# Patient Record
Sex: Female | Born: 1990 | Race: Black or African American | Hispanic: No | Marital: Single | Smoking: Never smoker
Health system: Southern US, Community
[De-identification: ages and names within clinical notes are randomized; demographics above are authoritative.]

## PROBLEM LIST (undated history)

## (undated) ENCOUNTER — Inpatient Hospital Stay (HOSPITAL_COMMUNITY): Payer: Self-pay

## (undated) DIAGNOSIS — Z789 Other specified health status: Secondary | ICD-10-CM

## (undated) HISTORY — PX: NO PAST SURGERIES: SHX2092

---

## 2010-07-25 ENCOUNTER — Ambulatory Visit (INDEPENDENT_AMBULATORY_CARE_PROVIDER_SITE_OTHER): Payer: BC Managed Care – PPO | Admitting: Women's Health

## 2010-07-25 DIAGNOSIS — Z23 Encounter for immunization: Secondary | ICD-10-CM

## 2010-07-25 DIAGNOSIS — Z7689 Persons encountering health services in other specified circumstances: Secondary | ICD-10-CM

## 2010-07-25 DIAGNOSIS — Z113 Encounter for screening for infections with a predominantly sexual mode of transmission: Secondary | ICD-10-CM

## 2010-07-25 DIAGNOSIS — Z01419 Encounter for gynecological examination (general) (routine) without abnormal findings: Secondary | ICD-10-CM

## 2010-07-25 DIAGNOSIS — N912 Amenorrhea, unspecified: Secondary | ICD-10-CM

## 2010-07-25 DIAGNOSIS — N926 Irregular menstruation, unspecified: Secondary | ICD-10-CM

## 2010-07-25 DIAGNOSIS — Z3041 Encounter for surveillance of contraceptive pills: Secondary | ICD-10-CM

## 2010-11-20 ENCOUNTER — Inpatient Hospital Stay (HOSPITAL_COMMUNITY)
Admission: RE | Admit: 2010-11-20 | Discharge: 2010-11-20 | Disposition: A | Discharge status: Home / Self Care | Payer: BC Managed Care – PPO | Source: Ambulatory Visit | Attending: Family Medicine | Admitting: Family Medicine

## 2010-12-25 ENCOUNTER — Ambulatory Visit (INDEPENDENT_AMBULATORY_CARE_PROVIDER_SITE_OTHER): Payer: BC Managed Care – PPO | Admitting: Anesthesiology

## 2010-12-25 DIAGNOSIS — Z23 Encounter for immunization: Secondary | ICD-10-CM

## 2010-12-27 ENCOUNTER — Telehealth: Payer: Self-pay | Admitting: *Deleted

## 2010-12-27 NOTE — Telephone Encounter (Signed)
Patient states she was treated for yeast infection by urgent care, but said symptoms were not gone.  Advised patient to call urgent care back to see if they could call something else in since they had just saw her.  Told her if not she should see if she could be seen or would need ov here to evaluate.

## 2012-02-03 ENCOUNTER — Emergency Department (HOSPITAL_COMMUNITY)
Admission: EM | Admit: 2012-02-03 | Discharge: 2012-02-03 | Discharge status: Absent without leave | Payer: BC Managed Care – PPO | Source: Home / Self Care

## 2012-02-03 NOTE — ED Notes (Signed)
Pt called in all waiting areas; no answer x 2 - B. Chrismon  EMT-P

## 2012-02-03 NOTE — ED Notes (Signed)
Pt called in all waiting areas; no answer x 1 

## 2012-02-03 NOTE — ED Notes (Signed)
No answer in lobby x 3 

## 2012-02-18 ENCOUNTER — Encounter: Payer: Self-pay | Admitting: Gynecology

## 2012-02-18 ENCOUNTER — Ambulatory Visit (INDEPENDENT_AMBULATORY_CARE_PROVIDER_SITE_OTHER): Payer: BC Managed Care – PPO | Admitting: Gynecology

## 2012-02-18 DIAGNOSIS — N9089 Other specified noninflammatory disorders of vulva and perineum: Secondary | ICD-10-CM

## 2012-02-18 DIAGNOSIS — N898 Other specified noninflammatory disorders of vagina: Secondary | ICD-10-CM

## 2012-02-18 LAB — WET PREP FOR TRICH, YEAST, CLUE
Clue Cells Wet Prep HPF POC: NONE SEEN
Trich, Wet Prep: NONE SEEN

## 2012-02-18 MED ORDER — FLUCONAZOLE 150 MG PO TABS: 150.0000 mg | ORAL_TABLET | Freq: Once | ORAL | Status: DC

## 2012-02-18 NOTE — Patient Instructions (Signed)
Take Diflucan pill today and repeat in 2 days. Follow up if symptoms persist or recur.

## 2012-02-18 NOTE — Progress Notes (Signed)
Patient presents with several days of vaginal irritation and discharge. Noticed itching and some vulvar discomfort with urination. No fever chills frequency dysuria. No odor.  Exam with Fleet Contras assistant Abdomen soft nontender without masses guarding rebound organomegaly. External BUS vagina with thick cakey discharge. Cervix normal. Uterus normal size, mobile nontender. Adnexa without masses or tenderness.  Assessment and plan: Exam/history/wet prep consistent with yeast. Fairly impressive discharge. We'll treat with Diflucan 150 QOD x2 doses.  Check urinalysis. STD screening offered and declined. Follow up if symptoms persist or recur.

## 2012-02-19 LAB — URINALYSIS W MICROSCOPIC + REFLEX CULTURE
Bacteria, UA: NONE SEEN
Bilirubin Urine: NEGATIVE
Crystals: NONE SEEN
Ketones, ur: NEGATIVE mg/dL
Protein, ur: NEGATIVE mg/dL
Specific Gravity, Urine: 1.022 (ref 1.005–1.030)
Urobilinogen, UA: 1 mg/dL (ref 0.0–1.0)

## 2012-02-27 ENCOUNTER — Other Ambulatory Visit (HOSPITAL_COMMUNITY)
Admission: RE | Admit: 2012-02-27 | Discharge: 2012-02-27 | Disposition: A | Discharge status: Home / Self Care | Payer: BC Managed Care – PPO | Source: Ambulatory Visit | Attending: Obstetrics and Gynecology | Admitting: Obstetrics and Gynecology

## 2012-02-27 ENCOUNTER — Encounter: Payer: Self-pay | Admitting: Women's Health

## 2012-02-27 ENCOUNTER — Ambulatory Visit (INDEPENDENT_AMBULATORY_CARE_PROVIDER_SITE_OTHER): Payer: BC Managed Care – PPO | Admitting: Women's Health

## 2012-02-27 VITALS — BP 118/72 | Ht 67.0 in | Wt 158.0 lb

## 2012-02-27 DIAGNOSIS — Z23 Encounter for immunization: Secondary | ICD-10-CM

## 2012-02-27 DIAGNOSIS — Z01419 Encounter for gynecological examination (general) (routine) without abnormal findings: Secondary | ICD-10-CM

## 2012-02-27 LAB — CBC WITH DIFFERENTIAL/PLATELET
Basophils Absolute: 0 10*3/uL (ref 0.0–0.1)
Basophils Relative: 1 % (ref 0–1)
Hemoglobin: 12.4 g/dL (ref 12.0–15.0)
MCHC: 31.9 g/dL (ref 30.0–36.0)
Monocytes Relative: 9 % (ref 3–12)
Neutro Abs: 1.8 10*3/uL (ref 1.7–7.7)
Neutrophils Relative %: 46 % (ref 43–77)
RDW: 12.7 % (ref 11.5–15.5)

## 2012-02-27 NOTE — Patient Instructions (Signed)
Health Maintenance, 18- to 21-Year-Old SCHOOL PERFORMANCE After high school completion, the Christine Mccarthy adult may be attending college, technical or vocational school, or entering the military or the work force. SOCIAL AND EMOTIONAL DEVELOPMENT The Christine Mccarthy adult establishes adult relationships and explores sexual identity. Christine Mccarthy adults may be living at home or in a college dorm or apartment. Increasing independence is important with Christine Mccarthy adults. Throughout adolescence, teens should assume responsibility of their own health care. IMMUNIZATIONS Most Christine Mccarthy adults should be fully vaccinated. A booster dose of Tdap (tetanus, diphtheria, and pertussis, or "whooping cough"), a dose of meningococcal vaccine to protect against a certain type of bacterial meningitis, hepatitis A, human papillomarvirus (HPV), chickenpox, or measles vaccines may be indicated, if not given at an earlier age. Annual influenza or "flu" vaccination should be considered during flu season.   TESTING Annual screening for vision and hearing problems is recommended. Vision should be screened objectively at least once between 18 and 21 years of age. The Christine Mccarthy adult may be screened for anemia or tuberculosis. Christine Mccarthy adults should have a blood test to check for high cholesterol during this time period. Christine Mccarthy adults should be screened for use of alcohol and drugs. If the Christine Mccarthy adult is sexually active, screening for sexually transmitted infections, pregnancy, or HIV may be performed. Screening for cervical cancer should be performed within 3 years of beginning sexual activity. NUTRITION AND ORAL HEALTH  Adequate calcium intake is important. Consume 3 servings of low-fat milk and dairy products daily. For those who do not drink milk or consume dairy products, calcium enriched foods, such as juice, bread, or cereal, dark, leafy greens, or canned fish are alternate sources of calcium.   Drink plenty of water. Limit fruit juice to 8 to 12 ounces per day.  Avoid sugary beverages or sodas.   Discourage skipping meals, especially breakfast. Teens should eat a good variety of vegetables and fruits, as well as lean meats.   Avoid high fat, high salt, and high sugar foods, such as candy, chips, and cookies.   Encourage Christine Mccarthy adults to participate in meal planning and preparation.   Eat meals together as a family whenever possible. Encourage conversation at mealtime.   Limit fast food choices and eating out at restaurants.   Brush teeth twice a day and floss.   Schedule dental exams twice a year.  SLEEP Regular sleep habits are important. PHYSICAL, SOCIAL, AND EMOTIONAL DEVELOPMENT  One hour of regular physical activity daily is recommended. Continue to participate in sports.   Encourage Christine Mccarthy adults to develop their own interests and consider community service or volunteerism.   Provide guidance to the Christine Mccarthy adult in making decisions about college and work plans.   Make sure that Christine Mccarthy adults know that they should never be in a situation that makes them uncomfortable, and they should tell partners if they do not want to engage in sexual activity.   Talk to the Christine Mccarthy adult about body image. Eating disorders may be noted at this time. Christine Mccarthy adults may also be concerned about being overweight. Monitor the Christine Mccarthy adult for weight gain or loss.   Mood disturbances, depression, anxiety, alcoholism, or attention problems may be noted in Christine Mccarthy adults. Talk to the caregiver if there are concerns about mental illness.   Negotiate limit setting and independent decision making.   Encourage the Christine Mccarthy adult to handle conflict without physical violence.   Avoid loud noises which may impair hearing.   Limit television and computer time to 2 hours per   day. Individuals who engage in excessive sedentary activity are more likely to become overweight.  RISK BEHAVIORS  Sexually active Christine Mccarthy adults need to take precautions against pregnancy and sexually  transmitted infections. Talk to Christine Mccarthy adults about contraception.   Provide a tobacco-free and drug-free environment for the Christine Mccarthy adult. Talk to the Christine Mccarthy adult about drug, tobacco, and alcohol use among friends or at friends' homes. Make sure the Christine Mccarthy adult knows that smoking tobacco or marijuana and taking drugs have health consequences and may impact brain development.   Teach the Christine Mccarthy adult about appropriate use of over-the-counter or prescription medicines.   Establish guidelines for driving and for riding with friends.   Talk to Christine Mccarthy adults about the risks of drinking and driving or boating. Encourage the Christine Mccarthy adult to call you if he or she or friends have been drinking or using drugs.   Remind Christine Mccarthy adults to wear seat belts at all times in cars and life vests in boats.   Christine Mccarthy adults should always wear a properly fitted helmet when they are riding a bicycle.   Use caution with all-terrain vehicles (ATVs) or other motorized vehicles.   Do not keep handguns in the home. (If you do, the gun and ammunition should be locked separately and out of the Christine Mccarthy adult's access.)   Equip your home with smoke detectors and change the batteries regularly. Make sure all family members know the fire escape plans for your home.   Teach Christine Mccarthy adults not to swim alone and not to dive in shallow water.   All individuals should wear sunscreen that protects against UVA and UVB light with at least a sun protection factor (SPF) of 30 when out in the sun. This minimizes sun burning.  WHAT'S NEXT? Christine Mccarthy adults should visit their pediatrician or family physician yearly. By Christine Mccarthy adulthood, health care should be transitioned to a family physician or internal medicine specialist. Sexually active females may want to begin annual physical exams with a gynecologist. Document Released: 06/05/2006 Document Revised: 06/02/2011 Document Reviewed: 06/25/2006 ExitCare Patient Information 2013 [employer].    

## 2012-02-27 NOTE — Progress Notes (Signed)
Christine Mccarthy Jul 30, 1990 [id number]    History:    The patient presents for annual exam.  5 day monthly cycle most months, occasionally skips 1-2 months. History of irregular cycles with normal TSH and prolactin 2012. Same partner with negative STD screen/condoms. Had started on Loestrin 1/20 but did not continue, infrequent intercourse and had trouble remembering. Had first 2 gardasil.   Past medical history, past surgical history, family history and social history were all reviewed and documented in the EPIC chart. 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   ROS:  A  ROS was performed and pertinent positives and negatives are included in the history.  Exam:  Filed Vitals:   02/27/12 1354  BP: 118/72    General appearance:  Normal Head/Neck:  Normal, without cervical or supraclavicular adenopathy. Thyroid:  Symmetrical, normal in size, without palpable masses or nodularity. Respiratory  Effort:  Normal  Auscultation:  Clear without wheezing or rhonchi Cardiovascular  Auscultation:  Regular rate, without rubs, murmurs or gallops  Edema/varicosities:  Not grossly evident Abdominal  Soft,nontender, without masses, guarding or rebound.  Liver/spleen:  No organomegaly noted  Hernia:  None appreciated  Skin  Inspection:  Grossly normal  Palpation:  Grossly normal Neurologic/psychiatric  Orientation:  Normal with appropriate conversation.  Mood/affect:  Normal  Genitourinary    Breasts: Examined lying and sitting.     Right: Without masses, retractions, discharge or axillary adenopathy.     Left: Without masses, retractions, discharge or axillary adenopathy.   Inguinal/mons:  Normal without inguinal adenopathy  External genitalia:  Normal  BUS/Urethra/Skene's glands:  Normal  Bladder:  Normal  Vagina:  Normal  Cervix:  Normal  Uterus:  normal in size, shape and contour.  Midline and mobile  Adnexa/parametria:     Rt: Without masses or  tenderness.   Lt: Without masses or tenderness.  Anus and perineum: Normal    Assessment/Plan:  21 y.o. SBF G0 for annual exam without complaint.  Occasional irregular cycles/normal TSH prolactin 2012   Plan: Contraception options reviewed and declined, will continue condoms when active. Plan B emergency contraception reviewed. SBE's, exercise, calcium rich diet, MVI daily encouraged, campus safety reviewed. CBC, UA, Pap. New screening guidelines reviewed. Third gardasil given today series completed.    Harrington Challenger WHNP, 2:25 PM 02/27/2012

## 2012-02-28 LAB — URINALYSIS W MICROSCOPIC + REFLEX CULTURE
Leukocytes, UA: NEGATIVE
Nitrite: NEGATIVE
Protein, ur: NEGATIVE mg/dL
Squamous Epithelial / LPF: NONE SEEN
Urobilinogen, UA: 0.2 mg/dL (ref 0.0–1.0)

## 2012-03-16 ENCOUNTER — Ambulatory Visit (INDEPENDENT_AMBULATORY_CARE_PROVIDER_SITE_OTHER): Payer: BC Managed Care – PPO | Admitting: Gynecology

## 2012-03-16 ENCOUNTER — Encounter: Payer: Self-pay | Admitting: Gynecology

## 2012-03-16 VITALS — BP 100/60

## 2012-03-16 DIAGNOSIS — Z113 Encounter for screening for infections with a predominantly sexual mode of transmission: Secondary | ICD-10-CM

## 2012-03-16 DIAGNOSIS — N898 Other specified noninflammatory disorders of vagina: Secondary | ICD-10-CM

## 2012-03-16 LAB — WET PREP FOR TRICH, YEAST, CLUE
Clue Cells Wet Prep HPF POC: NONE SEEN
Trich, Wet Prep: NONE SEEN

## 2012-03-16 MED ORDER — FLUCONAZOLE 100 MG PO TABS: ORAL_TABLET | ORAL | Status: DC

## 2012-03-16 NOTE — Progress Notes (Signed)
Patient presented to the office today stating the past few days she was complaining thick white fishy odor-like discharge. She has not been sexually active since October. She is on oral contraceptive pills and is having normal menstrual cycles.  Exam: Bartholin urethra Skene glands within normal limits Vagina: Thick white discharge erythematous vaginal mucosa Cervix: Same as above : Bimanual exam not done Rectal exam: Not done  GC and Chlamydia culture pending at time of this dictation  Wet prep many hyphae  Assessment/plan: Monilial vulvovaginitis will be treated with Diflucan 100 mg one by mouth every other day for 3 days. GC and Chlamydia culture pending at time of this dictation.

## 2012-03-16 NOTE — Patient Instructions (Signed)
Candidal Vulvovaginitis Candidal vulvovaginitis is an infection of the vagina and vulva. The vulva is the skin around the opening of the vagina. This may cause itching and discomfort in and around the vagina.  HOME CARE  Only take medicine as told by your doctor.  Do not have sex (intercourse) until the infection is healed or as told by your doctor.  Practice safe sex.  Tell your sex partner about your infection.  Do not douche or use tampons.  Wear cotton underwear. Do not wear tight pants or panty hose.  Eat yogurt. This may help treat and prevent yeast infections. GET HELP RIGHT AWAY IF:   You have a fever.  Your problems get worse during treatment or do not get better in 3 days.  You have discomfort, irritation, or itching in your vagina or vulva area.  You have pain after sex.  You start to get belly (abdominal) pain. MAKE SURE YOU:  Understand these instructions.  Will watch your condition.  Will get help right away if you are not doing well or get worse. Document Released: 06/06/2008 Document Revised: 06/02/2011 Document Reviewed: 06/06/2008 ExitCare Patient Information 2013 [employer].  

## 2012-12-07 ENCOUNTER — Encounter: Payer: Self-pay | Admitting: Women's Health

## 2012-12-07 ENCOUNTER — Ambulatory Visit (INDEPENDENT_AMBULATORY_CARE_PROVIDER_SITE_OTHER): Payer: BC Managed Care – PPO | Admitting: Women's Health

## 2012-12-07 DIAGNOSIS — N760 Acute vaginitis: Secondary | ICD-10-CM

## 2012-12-07 DIAGNOSIS — B9689 Other specified bacterial agents as the cause of diseases classified elsewhere: Secondary | ICD-10-CM

## 2012-12-07 DIAGNOSIS — A499 Bacterial infection, unspecified: Secondary | ICD-10-CM

## 2012-12-07 DIAGNOSIS — N898 Other specified noninflammatory disorders of vagina: Secondary | ICD-10-CM

## 2012-12-07 LAB — WET PREP FOR TRICH, YEAST, CLUE: Trich, Wet Prep: NONE SEEN

## 2012-12-07 MED ORDER — METRONIDAZOLE 500 MG PO TABS: 500.0000 mg | ORAL_TABLET | Freq: Two times a day (BID) | ORAL | Status: DC

## 2012-12-07 NOTE — Progress Notes (Signed)
Patient ID: Christine Mccarthy, female   DOB: 03-04-1991, 22 y.o.   MRN: [id number] Presents with complaint of vaginal discharge with an odor and some irritation. Had unprotected intercourse x1, monthly cycle/condoms. Denies urinary symptoms.  Exam: Appears well, external genitalia and within normal limits, speculum exam moderate amount of a weighted here and discharge with an odor present wet prep positive for amines, clues, and TNTC bacteria. Bimanual no CMT or adnexal tenderness. GC/Chlamydia culture taken.  Bacteria vaginosis  Plan: GC/Chlamydia culture pending. Will check HIV, hepatitis, RPR at annual exam. Flagyl 500 twice daily for 7 days, alcohol precautions reviewed. Instructed to call if no relief of discharge. Contraception options reviewed and declined will continue condoms.

## 2012-12-07 NOTE — Patient Instructions (Addendum)
Bacterial Vaginosis Bacterial vaginosis (BV) is a vaginal infection where the normal balance of bacteria in the vagina is disrupted. The normal balance is then replaced by an overgrowth of certain bacteria. There are several different kinds of bacteria that can cause BV. BV is the most common vaginal infection in women of childbearing age. CAUSES   The cause of BV is not fully understood. BV develops when there is an increase or imbalance of harmful bacteria.  Some activities or behaviors can upset the normal balance of bacteria in the vagina and put women at increased risk including:  Having a new sex partner or multiple sex partners.  Douching.  Using an intrauterine device (IUD) for contraception.  It is not clear what role sexual activity plays in the development of BV. However, women that have never had sexual intercourse are rarely infected with BV. Women do not get BV from toilet seats, bedding, swimming pools or from touching objects around them.  SYMPTOMS   Grey vaginal discharge.  A fish-like odor with discharge, especially after sexual intercourse.  Itching or burning of the vagina and vulva.  Burning or pain with urination.  Some women have no signs or symptoms at all. DIAGNOSIS  Your caregiver must examine the vagina for signs of BV. Your caregiver will perform lab tests and look at the sample of vaginal fluid through a microscope. They will look for bacteria and abnormal cells (clue cells), a pH test higher than 4.5, and a positive amine test all associated with BV.  RISKS AND COMPLICATIONS   Pelvic inflammatory disease (PID).  Infections following gynecology surgery.  Developing HIV.  Developing herpes virus. TREATMENT  Sometimes BV will clear up without treatment. However, all women with symptoms of BV should be treated to avoid complications, especially if gynecology surgery is planned. Female partners generally do not need to be treated. However, BV may spread  between female sex partners so treatment is helpful in preventing a recurrence of BV.   BV may be treated with antibiotics. The antibiotics come in either pill or vaginal cream forms. Either can be used with nonpregnant or pregnant women, but the recommended dosages differ. These antibiotics are not harmful to the baby.  BV can recur after treatment. If this happens, a second round of antibiotics will often be prescribed.  Treatment is important for pregnant women. If not treated, BV can cause a premature delivery, especially for a pregnant woman who had a premature birth in the past. All pregnant women who have symptoms of BV should be checked and treated.  For chronic reoccurrence of BV, treatment with a type of prescribed gel vaginally twice a week is helpful. HOME CARE INSTRUCTIONS   Finish all medication as directed by your caregiver.  Do not have sex until treatment is completed.  Tell your sexual partner that you have a vaginal infection. They should see their caregiver and be treated if they have problems, such as a mild rash or itching.  Practice safe sex. Use condoms. Only have 1 sex partner. PREVENTION  Basic prevention steps can help reduce the risk of upsetting the natural balance of bacteria in the vagina and developing BV:  Do not have sexual intercourse (be abstinent).  Do not douche.  Use all of the medicine prescribed for treatment of BV, even if the signs and symptoms go away.  Tell your sex partner if you have BV. That way, they can be treated, if needed, to prevent reoccurrence. SEEK MEDICAL CARE IF:     Your symptoms are not improving after 3 days of treatment.  You have increased discharge, pain, or fever. MAKE SURE YOU:   Understand these instructions.  Will watch your condition.  Will get help right away if you are not doing well or get worse. FOR MORE INFORMATION  Division of STD Prevention (DSTDP), Centers for Disease Control and Prevention:  www.cdc.gov/std American Social Health Association (ASHA): www.ashastd.org  Document Released: 03/10/2005 Document Revised: 06/02/2011 Document Reviewed: 08/31/2008 ExitCare Patient Information 2014 ExitCare[employer].  

## 2013-05-18 ENCOUNTER — Ambulatory Visit (INDEPENDENT_AMBULATORY_CARE_PROVIDER_SITE_OTHER): Payer: BC Managed Care – PPO | Admitting: Women's Health

## 2013-05-18 ENCOUNTER — Encounter: Payer: Self-pay | Admitting: Women's Health

## 2013-05-18 DIAGNOSIS — N760 Acute vaginitis: Secondary | ICD-10-CM

## 2013-05-18 DIAGNOSIS — A499 Bacterial infection, unspecified: Secondary | ICD-10-CM

## 2013-05-18 DIAGNOSIS — Z113 Encounter for screening for infections with a predominantly sexual mode of transmission: Secondary | ICD-10-CM

## 2013-05-18 DIAGNOSIS — B9689 Other specified bacterial agents as the cause of diseases classified elsewhere: Secondary | ICD-10-CM

## 2013-05-18 LAB — WET PREP FOR TRICH, YEAST, CLUE
TRICH WET PREP: NONE SEEN
WBC WET PREP: NONE SEEN
YEAST WET PREP: NONE SEEN

## 2013-05-18 MED ORDER — METRONIDAZOLE 500 MG PO TABS: 500.0000 mg | ORAL_TABLET | Freq: Two times a day (BID) | ORAL | Status: DC

## 2013-05-18 MED ORDER — METRONIDAZOLE 0.75 % VA GEL: VAGINAL | Status: DC

## 2013-05-18 NOTE — Patient Instructions (Signed)
Bacterial Vaginosis Bacterial vaginosis is an infection of the vagina. It happens when too many of certain germs (bacteria) grow in the vagina. HOME CARE  Take your medicine as told by your doctor.  Finish your medicine even if you start to feel better.  Do not have sex until you finish your medicine and are better.  Tell your sex partner that you have an infection. They should see their doctor for treatment.  Practice safe sex. Use condoms. Have only one sex partner. GET HELP IF:  You are not getting better after 3 days of treatment.  You have more grey fluid (discharge) coming from your vagina than before.  You have more pain than before.  You have a fever. MAKE SURE YOU:   Understand these instructions.  Will watch your condition.  Will get help right away if you are not doing well or get worse. Document Released: 12/18/2007 Document Revised: 12/29/2012 Document Reviewed: 10/20/2012 ExitCare Patient Information 2014 ExitCare[employer].  

## 2013-05-18 NOTE — Progress Notes (Signed)
Patient ID: Christine Mccarthy, female   DOB: 08-01-1990, 23 y.o.   MRN: [id number] Presents with complaint of vaginal discharge with odor.Had unprotected intercourse with new partner. Denies urinary symptoms. Monthly cycle/condoms.   Exam: Appears well. External genitalia within normal limits. Speculum exam scant white discharge, no erythema noted wet prep positive for amines, clues, and TNTC bacteria. Bimanual no CMT or adnexal fullness or tenderness. GC/Chlamydia culture taken.  Bacteria vaginosis STD screen Contraception management  Plan: Flagyl 500 twice daily for 7 days #14, alcohol precautions reviewed. GC/Chlamydia culture pending. Annual exam in 2 weeks will check HIV, hepatitis and RPR. Contraception options reviewed and declined will continue condoms. Plan B emergency contraception reviewed.

## 2013-05-19 LAB — GC/CHLAMYDIA PROBE AMP
CT Probe RNA: NEGATIVE
GC Probe RNA: NEGATIVE

## 2013-06-08 ENCOUNTER — Encounter: Payer: BC Managed Care – PPO | Admitting: Women's Health

## 2013-07-06 ENCOUNTER — Ambulatory Visit (INDEPENDENT_AMBULATORY_CARE_PROVIDER_SITE_OTHER): Payer: BC Managed Care – PPO | Admitting: Women's Health

## 2013-07-06 ENCOUNTER — Encounter: Payer: Self-pay | Admitting: Women's Health

## 2013-07-06 ENCOUNTER — Other Ambulatory Visit (HOSPITAL_COMMUNITY)
Admission: RE | Admit: 2013-07-06 | Discharge: 2013-07-06 | Disposition: A | Discharge status: Home / Self Care | Payer: BC Managed Care – PPO | Source: Ambulatory Visit | Attending: Gynecology | Admitting: Gynecology

## 2013-07-06 VITALS — BP 110/70 | Ht 67.0 in | Wt 165.0 lb

## 2013-07-06 DIAGNOSIS — Z01419 Encounter for gynecological examination (general) (routine) without abnormal findings: Secondary | ICD-10-CM

## 2013-07-06 DIAGNOSIS — N898 Other specified noninflammatory disorders of vagina: Secondary | ICD-10-CM

## 2013-07-06 DIAGNOSIS — Z113 Encounter for screening for infections with a predominantly sexual mode of transmission: Secondary | ICD-10-CM

## 2013-07-06 DIAGNOSIS — B9689 Other specified bacterial agents as the cause of diseases classified elsewhere: Secondary | ICD-10-CM | POA: Insufficient documentation

## 2013-07-06 DIAGNOSIS — A499 Bacterial infection, unspecified: Secondary | ICD-10-CM

## 2013-07-06 DIAGNOSIS — N760 Acute vaginitis: Secondary | ICD-10-CM

## 2013-07-06 LAB — CBC WITH DIFFERENTIAL/PLATELET
Basophils Absolute: 0 10*3/uL (ref 0.0–0.1)
Basophils Relative: 0 % (ref 0–1)
Eosinophils Absolute: 0 10*3/uL (ref 0.0–0.7)
Eosinophils Relative: 1 % (ref 0–5)
HEMATOCRIT: 39.2 % (ref 36.0–46.0)
HEMOGLOBIN: 12.9 g/dL (ref 12.0–15.0)
LYMPHS PCT: 34 % (ref 12–46)
Lymphs Abs: 1.7 10*3/uL (ref 0.7–4.0)
MCH: 27.9 pg (ref 26.0–34.0)
MCHC: 32.9 g/dL (ref 30.0–36.0)
MCV: 84.7 fL (ref 78.0–100.0)
MONO ABS: 0.4 10*3/uL (ref 0.1–1.0)
MONOS PCT: 8 % (ref 3–12)
NEUTROS ABS: 2.8 10*3/uL (ref 1.7–7.7)
Neutrophils Relative %: 57 % (ref 43–77)
Platelets: 271 10*3/uL (ref 150–400)
RBC: 4.63 MIL/uL (ref 3.87–5.11)
RDW: 13 % (ref 11.5–15.5)
WBC: 4.9 10*3/uL (ref 4.0–10.5)

## 2013-07-06 LAB — WET PREP FOR TRICH, YEAST, CLUE
TRICH WET PREP: NONE SEEN
YEAST WET PREP: NONE SEEN

## 2013-07-06 MED ORDER — METRONIDAZOLE 0.75 % VA GEL: VAGINAL | Status: DC

## 2013-07-06 NOTE — Progress Notes (Signed)
Christine Mccarthy 03-28-1990 [id number]    History:    Presents for annual exam.  Regular monthly cycle/5 days/condoms. Same partner. Gardasil series completed. Negative GC/Chlamydia 04/2013. 2013 LGSIL without followup.  Past medical history, past surgical history, family history and social history were all reviewed and documented in the EPIC chart. Senior at [employer].  ROS:  A 12 POINT ROS was performed and pertinent positives and negatives are included.  Exam:  Filed Vitals:   07/06/13 1205  BP: 110/70    General appearance:  Normal Thyroid:  Symmetrical, normal in size, without palpable masses or nodularity. Respiratory  Auscultation:  Clear without wheezing or rhonchi Cardiovascular  Auscultation:  Regular rate, without rubs, murmurs or gallops  Edema/varicosities:  Not grossly evident Abdominal  Soft,nontender, without masses, guarding or rebound.  Liver/spleen:  No organomegaly noted  Hernia:  None appreciated  Skin  Inspection:  Grossly normal   Breasts: Examined lying and sitting.     Right: Without masses, retractions, discharge or axillary adenopathy.     Left: Without masses, retractions, discharge or axillary adenopathy. Gentitourinary   Inguinal/mons:  Normal without inguinal adenopathy  External genitalia:  Normal  BUS/Urethra/Skene's glands:  Normal  Vagina:  Wet prep positive for amines, clues, TNTC bacteria  Cervix:  Normal  Uterus:   normal in size, shape and contour.  Midline and mobile  Adnexa/parametria:     Rt: Without masses or tenderness.   Lt: Without masses or tenderness.  Anus and perineum: Normal    Assessment/Plan:  23 y.o. SBF G0 for annual exam with complaint of vaginal discharge  Recurrent bacteria vaginosis STD screen Monthly cycle/condoms 2013 LGSIL without followup.  Plan: MetroGel vaginal cream 1 applicator at bedtime x5 and then monthly after cycle for prevention, instructed to call if continued problems.  Alcohol precautions reviewed. Contraception options reviewed declined will continue condoms. CBC, UA, Pap, HIV, hep B, C., RPR.    [place], 1:22 PM 07/06/2013

## 2013-07-06 NOTE — Patient Instructions (Signed)

## 2013-07-06 NOTE — Addendum Note (Signed)
Addended by: Alen Blew on: 07/06/2013 02:37 PM   Modules accepted: Orders

## 2013-07-07 LAB — URINALYSIS W MICROSCOPIC + REFLEX CULTURE
BILIRUBIN URINE: NEGATIVE
Bacteria, UA: NONE SEEN
CRYSTALS: NONE SEEN
Casts: NONE SEEN
GLUCOSE, UA: NEGATIVE mg/dL
Hgb urine dipstick: NEGATIVE
Ketones, ur: NEGATIVE mg/dL
LEUKOCYTES UA: NEGATIVE
Nitrite: NEGATIVE
Protein, ur: NEGATIVE mg/dL
SPECIFIC GRAVITY, URINE: 1.03 (ref 1.005–1.030)
Urobilinogen, UA: 0.2 mg/dL (ref 0.0–1.0)
pH: 6 (ref 5.0–8.0)

## 2013-07-07 LAB — HIV ANTIBODY (ROUTINE TESTING W REFLEX): HIV 1&2 Ab, 4th Generation: NONREACTIVE

## 2013-07-07 LAB — HEPATITIS B SURFACE ANTIGEN: HEP B S AG: NEGATIVE

## 2013-07-07 LAB — HEPATITIS C ANTIBODY: HCV Ab: NEGATIVE

## 2013-07-07 LAB — RPR

## 2013-07-26 ENCOUNTER — Telehealth: Payer: Self-pay | Admitting: *Deleted

## 2013-07-26 NOTE — Telephone Encounter (Signed)
Pt c/o white clumpy discharge from Metrogel prescribed on 07/06/13. I informed pt normal to have clumpy discharge with Metrogel , pt not having any itching. Pt will follow up if needed

## 2013-08-05 ENCOUNTER — Telehealth: Payer: Self-pay | Admitting: *Deleted

## 2013-08-05 MED ORDER — FLUCONAZOLE 150 MG PO TABS: 150.0000 mg | ORAL_TABLET | Freq: Once | ORAL | Status: DC

## 2013-08-05 NOTE — Telephone Encounter (Signed)
Diflucan 150 mg x1 dose. Office visit if symptoms persist

## 2013-08-05 NOTE — Telephone Encounter (Signed)
You are back up MD, pt was treated with metrogel on OV 07/06/13 now c/o white discharge. Pt would like Rx for yeast. Please advise

## 2013-08-05 NOTE — Telephone Encounter (Signed)
Pt informed, rx sent 

## 2013-09-20 ENCOUNTER — Telehealth: Payer: Self-pay | Admitting: *Deleted

## 2013-09-20 NOTE — Telephone Encounter (Signed)
Pt used extra refill for "MetroGel vaginal cream 1 applicator at bedtime x5 and then monthly after cycle" on OV 07/06/13  Started Rx on Sunday unable to complete due to cycle, pt asked if you could give her pill form? Please advise

## 2013-09-20 NOTE — Telephone Encounter (Signed)
Pt called requesting refill on yeast medication, I called pt back and left message on voicemail to make OV per TF

## 2013-09-20 NOTE — Telephone Encounter (Signed)
I left the below on pt voicemail, told her to call me.

## 2013-09-20 NOTE — Telephone Encounter (Signed)
Instruct to use only 1 applicator per month after cycle. Is she symptomatic now?

## 2014-01-06 ENCOUNTER — Ambulatory Visit (INDEPENDENT_AMBULATORY_CARE_PROVIDER_SITE_OTHER): Payer: BC Managed Care – PPO | Admitting: Women's Health

## 2014-01-06 ENCOUNTER — Encounter: Payer: Self-pay | Admitting: Women's Health

## 2014-01-06 VITALS — BP 110/70 | Ht 67.0 in | Wt 162.0 lb

## 2014-01-06 DIAGNOSIS — A499 Bacterial infection, unspecified: Secondary | ICD-10-CM

## 2014-01-06 DIAGNOSIS — N760 Acute vaginitis: Secondary | ICD-10-CM

## 2014-01-06 DIAGNOSIS — N898 Other specified noninflammatory disorders of vagina: Secondary | ICD-10-CM

## 2014-01-06 DIAGNOSIS — R350 Frequency of micturition: Secondary | ICD-10-CM

## 2014-01-06 DIAGNOSIS — B9689 Other specified bacterial agents as the cause of diseases classified elsewhere: Secondary | ICD-10-CM

## 2014-01-06 LAB — URINALYSIS W MICROSCOPIC + REFLEX CULTURE
BILIRUBIN URINE: NEGATIVE
Glucose, UA: NEGATIVE mg/dL
Hgb urine dipstick: NEGATIVE
Ketones, ur: NEGATIVE mg/dL
LEUKOCYTES UA: NEGATIVE
NITRITE: NEGATIVE
Protein, ur: NEGATIVE mg/dL
SPECIFIC GRAVITY, URINE: 1.02 (ref 1.005–1.030)
Urobilinogen, UA: 1 mg/dL (ref 0.0–1.0)
pH: 7 (ref 5.0–8.0)

## 2014-01-06 LAB — WET PREP FOR TRICH, YEAST, CLUE
Trich, Wet Prep: NONE SEEN
Yeast Wet Prep HPF POC: NONE SEEN

## 2014-01-06 MED ORDER — FLUCONAZOLE 150 MG PO TABS: 150.0000 mg | ORAL_TABLET | Freq: Once | ORAL | Status: DC

## 2014-01-06 MED ORDER — METRONIDAZOLE 0.75 % VA GEL: VAGINAL | Status: DC

## 2014-01-06 NOTE — Patient Instructions (Signed)
Bacterial Vaginosis Bacterial vaginosis is an infection of the vagina. It happens when too many of certain germs (bacteria) grow in the vagina. HOME CARE  Take your medicine as told by your doctor.  Finish your medicine even if you start to feel better.  Do not have sex until you finish your medicine and are better.  Tell your sex partner that you have an infection. They should see their doctor for treatment.  Practice safe sex. Use condoms. Have only one sex partner. GET HELP IF:  You are not getting better after 3 days of treatment.  You have more grey fluid (discharge) coming from your vagina than before.  You have more pain than before.  You have a fever. MAKE SURE YOU:   Understand these instructions.  Will watch your condition.  Will get help right away if you are not doing well or get worse. Document Released: 12/18/2007 Document Revised: 12/29/2012 Document Reviewed: 10/20/2012 ExitCare Patient Information 2015 [employer]. This information is not intended to replace advice given to you by your health care provider. Make sure you discuss any questions you have with your health care provider.  

## 2014-01-06 NOTE — Progress Notes (Signed)
Patient ID: Christine Mccarthy, female   DOB: [birth year], 23 y.o.   MRN: [id number] Presents with complaint of vaginal discharge with odor, increased urinary frequency with urgency, denies pain or burning with urination for several days. Also struggling with anxiety/depression. Denies any suicidal ideation. Same partner with negative STD screen. Student with dual enrollment at [employer] and [employer] [facility]. Denies abdominal pain or fever.  Exam: Appears well, appropriate questions, affect. External genitalia within normal limits, speculum exam moderate amount of milky discharge with an odor. Wet prep positive for amines, clues, TNTC bacteria. Bimanual no CMT or adnexal fullness or tenderness. UA: Negative  Bacteria vaginosis Probable anxiety/depression  Plan: Reviewed importance of followup with counseling services at [employer] or [employer] [facility]. Reviewed importance of increasing regular daily exercise. MetroGel vaginal cream 1 applicator at bedtime x5, alcohol precautions reviewed. Diflucan 150. X1 dose if vaginal itching after completing MetroGel.

## 2014-05-25 ENCOUNTER — Encounter: Payer: Self-pay | Admitting: Women's Health

## 2014-05-25 ENCOUNTER — Ambulatory Visit (INDEPENDENT_AMBULATORY_CARE_PROVIDER_SITE_OTHER): Payer: BLUE CROSS/BLUE SHIELD | Admitting: Women's Health

## 2014-05-25 ENCOUNTER — Ambulatory Visit: Payer: Self-pay | Admitting: Women's Health

## 2014-05-25 VITALS — BP 115/78 | Ht 67.0 in | Wt 159.0 lb

## 2014-05-25 DIAGNOSIS — N760 Acute vaginitis: Secondary | ICD-10-CM

## 2014-05-25 DIAGNOSIS — R350 Frequency of micturition: Secondary | ICD-10-CM | POA: Diagnosis not present

## 2014-05-25 DIAGNOSIS — Z30011 Encounter for initial prescription of contraceptive pills: Secondary | ICD-10-CM | POA: Diagnosis not present

## 2014-05-25 DIAGNOSIS — N898 Other specified noninflammatory disorders of vagina: Secondary | ICD-10-CM | POA: Diagnosis not present

## 2014-05-25 DIAGNOSIS — A499 Bacterial infection, unspecified: Secondary | ICD-10-CM

## 2014-05-25 DIAGNOSIS — B9689 Other specified bacterial agents as the cause of diseases classified elsewhere: Secondary | ICD-10-CM

## 2014-05-25 LAB — URINALYSIS W MICROSCOPIC + REFLEX CULTURE
Bilirubin Urine: NEGATIVE
GLUCOSE, UA: NEGATIVE mg/dL
Hgb urine dipstick: NEGATIVE
KETONES UR: NEGATIVE mg/dL
Leukocytes, UA: NEGATIVE
NITRITE: NEGATIVE
PH: 6 (ref 5.0–8.0)
Protein, ur: NEGATIVE mg/dL
SPECIFIC GRAVITY, URINE: 1.025 (ref 1.005–1.030)
Urobilinogen, UA: 0.2 mg/dL (ref 0.0–1.0)

## 2014-05-25 LAB — PREGNANCY, URINE: Preg Test, Ur: NEGATIVE

## 2014-05-25 LAB — WET PREP FOR TRICH, YEAST, CLUE
TRICH WET PREP: NONE SEEN
WBC, Wet Prep HPF POC: NONE SEEN
YEAST WET PREP: NONE SEEN

## 2014-05-25 MED ORDER — METRONIDAZOLE 500 MG PO TABS: 500.0000 mg | ORAL_TABLET | Freq: Two times a day (BID) | ORAL | Status: DC

## 2014-05-25 MED ORDER — NORETHIN ACE-ETH ESTRAD-FE 1-20 MG-MCG PO TABS
1.0000 | ORAL_TABLET | Freq: Every day | ORAL | Status: DC
Start: 2014-05-25 — End: 2014-07-12

## 2014-05-25 NOTE — Progress Notes (Signed)
Patient ID: Christine Mccarthy, female   DOB: [birth year], 24 y.o.   MRN: [id number] Presents with complaint of increased urinary frequency without pain or burning, vaginal discharge with odor, and unprotected intercourse 1  05/21/14.  LMP 05/11/14  lasted 3 days, shorter than usual. Questions partner fidelity. Denies abdominal pain, fever, or need for HIV, hepatitis or RPR.  Exam: Appears well. Abdomen soft nontender. External genitalia within normal limits, speculum exam moderate amount of a white adherent discharge with odor noted, wet prep positive for amines, clues, TNTC bacteria. GC/Chlamydia culture pending. Bimanual no CMT or adnexal fullness or tenderness, uterus small. UA negative, UPT negative.  Bacterial vaginosis Contraception management  Plan: Reviewed greater than 72 hours since unprotected intercourse, Plan B emergency contraception most likely not effective. Contraception options reviewed, Loestrin 1/20 prescription, proper use, slight risk for blood clots and strokes reviewed. Start first day of next cycle. Condoms until and until permanent partner. Flagyl 500 twice a day for 7 days prescription, proper use given and reviewed alcohol precautions. Call or return if no cycle or if discharge does not resolve.

## 2014-05-25 NOTE — Patient Instructions (Signed)
Bacterial Vaginosis Bacterial vaginosis is an infection of the vagina. It happens when too many of certain germs (bacteria) grow in the vagina. HOME CARE  Take your medicine as told by your doctor.  Finish your medicine even if you start to feel better.  Do not have sex until you finish your medicine and are better.  Tell your sex partner that you have an infection. They should see their doctor for treatment.  Practice safe sex. Use condoms. Have only one sex partner. GET HELP IF:  You are not getting better after 3 days of treatment.  You have more grey fluid (discharge) coming from your vagina than before.  You have more pain than before.  You have a fever. MAKE SURE YOU:   Understand these instructions.  Will watch your condition.  Will get help right away if you are not doing well or get worse. Document Released: 12/18/2007 Document Revised: 12/29/2012 Document Reviewed: 10/20/2012 ExitCare Patient Information 2015 [employer]. This information is not intended to replace advice given to you by your health care provider. Make sure you discuss any questions you have with your health care provider.  

## 2014-05-26 LAB — GC/CHLAMYDIA PROBE AMP
CT Probe RNA: NEGATIVE
GC Probe RNA: NEGATIVE

## 2014-07-11 ENCOUNTER — Encounter: Payer: BLUE CROSS/BLUE SHIELD | Admitting: Women's Health

## 2014-07-12 ENCOUNTER — Telehealth: Payer: Self-pay | Admitting: Gynecology

## 2014-07-12 ENCOUNTER — Ambulatory Visit (INDEPENDENT_AMBULATORY_CARE_PROVIDER_SITE_OTHER): Payer: BLUE CROSS/BLUE SHIELD | Admitting: Women's Health

## 2014-07-12 ENCOUNTER — Encounter: Payer: Self-pay | Admitting: Women's Health

## 2014-07-12 VITALS — BP 124/80 | Ht 67.0 in | Wt 158.0 lb

## 2014-07-12 DIAGNOSIS — Z01419 Encounter for gynecological examination (general) (routine) without abnormal findings: Secondary | ICD-10-CM | POA: Diagnosis not present

## 2014-07-12 DIAGNOSIS — N912 Amenorrhea, unspecified: Secondary | ICD-10-CM | POA: Diagnosis not present

## 2014-07-12 LAB — CBC WITH DIFFERENTIAL/PLATELET
BASOS PCT: 0 % (ref 0–1)
Basophils Absolute: 0 10*3/uL (ref 0.0–0.1)
EOS ABS: 0.1 10*3/uL (ref 0.0–0.7)
EOS PCT: 2 % (ref 0–5)
HCT: 39.2 % (ref 36.0–46.0)
Hemoglobin: 12.6 g/dL (ref 12.0–15.0)
Lymphocytes Relative: 43 % (ref 12–46)
Lymphs Abs: 2.1 10*3/uL (ref 0.7–4.0)
MCH: 27.9 pg (ref 26.0–34.0)
MCHC: 32.1 g/dL (ref 30.0–36.0)
MCV: 86.7 fL (ref 78.0–100.0)
MONO ABS: 0.5 10*3/uL (ref 0.1–1.0)
MPV: 11.6 fL (ref 8.6–12.4)
Monocytes Relative: 11 % (ref 3–12)
Neutro Abs: 2.1 10*3/uL (ref 1.7–7.7)
Neutrophils Relative %: 44 % (ref 43–77)
PLATELETS: 262 10*3/uL (ref 150–400)
RBC: 4.52 MIL/uL (ref 3.87–5.11)
RDW: 12.9 % (ref 11.5–15.5)
WBC: 4.8 10*3/uL (ref 4.0–10.5)

## 2014-07-12 LAB — PREGNANCY, URINE: PREG TEST UR: NEGATIVE

## 2014-07-12 MED ORDER — MEDROXYPROGESTERONE ACETATE 10 MG PO TABS: 10.0000 mg | ORAL_TABLET | Freq: Every day | ORAL | Status: DC

## 2014-07-12 NOTE — Telephone Encounter (Signed)
07/12/14- I LM VM for pt that her BC ins will cover the Mirena & insertion for contraception at 100%, no copay or deductible. Advised insertion with TF during cycle.wl

## 2014-07-12 NOTE — Patient Instructions (Signed)
Health Maintenance Adopting a healthy lifestyle and getting preventive care can go a long way to promote health and wellness. Talk with your health care provider about what schedule of regular examinations is right for you. This is a good chance for you to check in with your provider about disease prevention and staying healthy. In between checkups, there are plenty of things you can do on your own. Experts have done a lot of research about which lifestyle changes and preventive measures are most likely to keep you healthy. Ask your health care provider for more information. WEIGHT AND DIET  Eat a healthy diet  Be sure to include plenty of vegetables, fruits, low-fat dairy products, and lean protein.  Do not eat a lot of foods high in solid fats, added sugars, or salt.  Get regular exercise. This is one of the most important things you can do for your health.  Most adults should exercise for at least 150 minutes each week. The exercise should increase your heart rate and make you sweat (moderate-intensity exercise).  Most adults should also do strengthening exercises at least twice a week. This is in addition to the moderate-intensity exercise.  Maintain a healthy weight  Body mass index (BMI) is a measurement that can be used to identify possible weight problems. It estimates body fat based on height and weight. Your health care provider can help determine your BMI and help you achieve or maintain a healthy weight.  For females 61 years of age and older:   A BMI below 18.5 is considered underweight.  A BMI of 18.5 to 24.9 is normal.  A BMI of 25 to 29.9 is considered overweight.  A BMI of 30 and above is considered obese.  Watch levels of cholesterol and blood lipids  You should start having your blood tested for lipids and cholesterol at 24 years of age, then have this test every 5 years.  You may need to have your cholesterol levels checked more often if:  Your lipid or  cholesterol levels are high.  You are older than 24 years of age.  You are at high risk for heart disease.  CANCER SCREENING   Lung Cancer  Lung cancer screening is recommended for adults 77-19 years old who are at high risk for lung cancer because of a history of smoking.  A yearly low-dose CT scan of the lungs is recommended for people who:  Currently smoke.  Have quit within the past 15 years.  Have at least a 30-pack-year history of smoking. A pack year is smoking an average of one pack of cigarettes a day for 1 year.  Yearly screening should continue until it has been 15 years since you quit.  Yearly screening should stop if you develop a health problem that would prevent you from having lung cancer treatment.  Breast Cancer  Practice breast self-awareness. This means understanding how your breasts normally appear and feel.  It also means doing regular breast self-exams. Let your health care provider know about any changes, no matter how small.  If you are in your 20s or 30s, you should have a clinical breast exam (CBE) by a health care provider every 1-3 years as part of a regular health exam.  If you are 15 or older, have a CBE every year. Also consider having a breast X-ray (mammogram) every year.  If you have a family history of breast cancer, talk to your health care provider about genetic screening.  If you are  at high risk for breast cancer, talk to your health care provider about having an MRI and a mammogram every year.  Breast cancer gene (BRCA) assessment is recommended for women who have family members with BRCA-related cancers. BRCA-related cancers include:  Breast.  Ovarian.  Tubal.  Peritoneal cancers.  Results of the assessment will determine the need for genetic counseling and BRCA1 and BRCA2 testing. Cervical Cancer Routine pelvic examinations to screen for cervical cancer are no longer recommended for nonpregnant women who are considered low  risk for cancer of the pelvic organs (ovaries, uterus, and vagina) and who do not have symptoms. A pelvic examination may be necessary if you have symptoms including those associated with pelvic infections. Ask your health care provider if a screening pelvic exam is right for you.   The Pap test is the screening test for cervical cancer for women who are considered at risk.  If you had a hysterectomy for a problem that was not cancer or a condition that could lead to cancer, then you no longer need Pap tests.  If you are older than 65 years, and you have had normal Pap tests for the past 10 years, you no longer need to have Pap tests.  If you have had past treatment for cervical cancer or a condition that could lead to cancer, you need Pap tests and screening for cancer for at least 20 years after your treatment.  If you no longer get a Pap test, assess your risk factors if they change (such as having a new sexual partner). This can affect whether you should start being screened again.  Some women have medical problems that increase their chance of getting cervical cancer. If this is the case for you, your health care provider may recommend more frequent screening and Pap tests.  The human papillomavirus (HPV) test is another test that may be used for cervical cancer screening. The HPV test looks for the virus that can cause cell changes in the cervix. The cells collected during the Pap test can be tested for HPV.  The HPV test can be used to screen women 30 years of age and older. Getting tested for HPV can extend the interval between normal Pap tests from three to five years.  An HPV test also should be used to screen women of any age who have unclear Pap test results.  After 24 years of age, women should have HPV testing as often as Pap tests.  Colorectal Cancer  This type of cancer can be detected and often prevented.  Routine colorectal cancer screening usually begins at 24 years of  age and continues through 24 years of age.  Your health care provider may recommend screening at an earlier age if you have risk factors for colon cancer.  Your health care provider may also recommend using home test kits to check for hidden blood in the stool.  A small camera at the end of a tube can be used to examine your colon directly (sigmoidoscopy or colonoscopy). This is done to check for the earliest forms of colorectal cancer.  Routine screening usually begins at age 50.  Direct examination of the colon should be repeated every 5-10 years through 24 years of age. However, you may need to be screened more often if early forms of precancerous polyps or small growths are found. Skin Cancer  Check your skin from head to toe regularly.  Tell your health care provider about any new moles or changes in   moles, especially if there is a change in a mole's shape or color.  Also tell your health care provider if you have a mole that is larger than the size of a pencil eraser.  Always use sunscreen. Apply sunscreen liberally and repeatedly throughout the day.  Protect yourself by wearing long sleeves, pants, a wide-brimmed hat, and sunglasses whenever you are outside. HEART DISEASE, DIABETES, AND HIGH BLOOD PRESSURE   Have your blood pressure checked at least every 1-2 years. High blood pressure causes heart disease and increases the risk of stroke.  If you are between 75 years and 42 years old, ask your health care provider if you should take aspirin to prevent strokes.  Have regular diabetes screenings. This involves taking a blood sample to check your fasting blood sugar level.  If you are at a normal weight and have a low risk for diabetes, have this test once every three years after 25 years of age.  If you are overweight and have a high risk for diabetes, consider being tested at a younger age or more often. PREVENTING INFECTION  Hepatitis B  If you have a higher risk for  hepatitis B, you should be screened for this virus. You are considered at high risk for hepatitis B if:  You were born in a country where hepatitis B is common. Ask your health care provider which countries are considered high risk.  Your parents were born in a high-risk country, and you have not been immunized against hepatitis B (hepatitis B vaccine).  You have HIV or AIDS.  You use needles to inject street drugs.  You live with someone who has hepatitis B.  You have had sex with someone who has hepatitis B.  You get hemodialysis treatment.  You take certain medicines for conditions, including cancer, organ transplantation, and autoimmune conditions. Hepatitis C  Blood testing is recommended for:  Everyone born from [birth year] through 1965.  Anyone with known risk factors for hepatitis C. Sexually transmitted infections (STIs)  You should be screened for sexually transmitted infections (STIs) including gonorrhea and chlamydia if:  You are sexually active and are younger than 24 years of age.  You are older than 24 years of age and your health care provider tells you that you are at risk for this type of infection.  Your sexual activity has changed since you were last screened and you are at an increased risk for chlamydia or gonorrhea. Ask your health care provider if you are at risk.  If you do not have HIV, but are at risk, it may be recommended that you take a prescription medicine daily to prevent HIV infection. This is called pre-exposure prophylaxis (PrEP). You are considered at risk if:  You are sexually active and do not regularly use condoms or know the HIV status of your partner(s).  You take drugs by injection.  You are sexually active with a partner who has HIV. Talk with your health care provider about whether you are at high risk of being infected with HIV. If you choose to begin PrEP, you should first be tested for HIV. You should then be tested every 3 months for  as long as you are taking PrEP.  PREGNANCY   If you are premenopausal and you may become pregnant, ask your health care provider about preconception counseling.  If you may become pregnant, take 400 to 800 micrograms (mcg) of folic acid every day.  If you want to prevent pregnancy, talk to your  health care provider about birth control (contraception). OSTEOPOROSIS AND MENOPAUSE   Osteoporosis is a disease in which the bones lose minerals and strength with aging. This can result in serious bone fractures. Your risk for osteoporosis can be identified using a bone density scan.  If you are 65 years of age or older, or if you are at risk for osteoporosis and fractures, ask your health care provider if you should be screened.  Ask your health care provider whether you should take a calcium or vitamin D supplement to lower your risk for osteoporosis.  Menopause may have certain physical symptoms and risks.  Hormone replacement therapy may reduce some of these symptoms and risks. Talk to your health care provider about whether hormone replacement therapy is right for you.  HOME CARE INSTRUCTIONS   Schedule regular health, dental, and eye exams.  Stay current with your immunizations.   Do not use any tobacco products including cigarettes, chewing tobacco, or electronic cigarettes.  If you are pregnant, do not drink alcohol.  If you are breastfeeding, limit how much and how often you drink alcohol.  Limit alcohol intake to no more than 1 drink per day for nonpregnant women. One drink equals 12 ounces of beer, 5 ounces of wine, or 1 ounces of hard liquor.  Do not use street drugs.  Do not share needles.  Ask your health care provider for help if you need support or information about quitting drugs.  Tell your health care provider if you often feel depressed.  Tell your health care provider if you have ever been abused or do not feel safe at home. Document Released: 09/23/2010  Document Revised: 07/25/2013 Document Reviewed: 02/09/2013 Exit[employer] Patient Information 2015 [employer]. This information is not intended to replace advice given to you by your health care provider. Make sure you discuss any questions you have with your health care provider. Levonorgestrel intrauterine device (IUD) What is this medicine? LEVONORGESTREL IUD (LEE voe nor jes trel) is a contraceptive (birth control) device. The device is placed inside the uterus by a healthcare professional. It is used to prevent pregnancy and can also be used to treat heavy bleeding that occurs during your period. Depending on the device, it can be used for 3 to 5 years. This medicine may be used for other purposes; ask your health care provider or pharmacist if you have questions. COMMON BRAND NAME(S): LILETTA, Mirena, Skyla What should I tell my health care provider before I take this medicine? They need to know if you have any of these conditions: -abnormal Pap smear -cancer of the breast, uterus, or cervix -diabetes -endometritis -genital or pelvic infection now or in the past -have more than one sexual partner or your partner has more than one partner -heart disease -history of an ectopic or tubal pregnancy -immune system problems -IUD in place -liver disease or tumor -problems with blood clots or take blood-thinners -use intravenous drugs -uterus of unusual shape -vaginal bleeding that has not been explained -an unusual or allergic reaction to levonorgestrel, other hormones, silicone, or polyethylene, medicines, foods, dyes, or preservatives -pregnant or trying to get pregnant -breast-feeding How should I use this medicine? This device is placed inside the uterus by a health care professional. Talk to your pediatrician regarding the use of this medicine in children. Special care may be needed. Overdosage: If you think you have taken too much of this medicine contact a poison control center or  emergency room at once. NOTE: This medicine is   only for you. Do not share this medicine with others. What if I miss a dose? This does not apply. What may interact with this medicine? Do not take this medicine with any of the following medications: -amprenavir -bosentan -fosamprenavir This medicine may also interact with the following medications: -aprepitant -barbiturate medicines for inducing sleep or treating seizures -bexarotene -griseofulvin -medicines to treat seizures like carbamazepine, ethotoin, felbamate, oxcarbazepine, phenytoin, topiramate -modafinil -pioglitazone -rifabutin -rifampin -rifapentine -some medicines to treat HIV infection like atazanavir, indinavir, lopinavir, nelfinavir, tipranavir, ritonavir -St. John's wort -warfarin This list may not describe all possible interactions. Give your health care provider a list of all the medicines, herbs, non-prescription drugs, or dietary supplements you use. Also tell them if you smoke, drink alcohol, or use illegal drugs. Some items may interact with your medicine. What should I watch for while using this medicine? Visit your doctor or health care professional for regular check ups. See your doctor if you or your partner has sexual contact with others, becomes HIV positive, or gets a sexual transmitted disease. This product does not protect you against HIV infection (AIDS) or other sexually transmitted diseases. You can check the placement of the IUD yourself by reaching up to the top of your vagina with clean fingers to feel the threads. Do not pull on the threads. It is a good habit to check placement after each menstrual period. Call your doctor right away if you feel more of the IUD than just the threads or if you cannot feel the threads at all. The IUD may come out by itself. You may become pregnant if the device comes out. If you notice that the IUD has come out use a backup birth control method like condoms and call your  health care provider. Using tampons will not change the position of the IUD and are okay to use during your period. What side effects may I notice from receiving this medicine? Side effects that you should report to your doctor or health care professional as soon as possible: -allergic reactions like skin rash, itching or hives, swelling of the face, lips, or tongue -fever, flu-like symptoms -genital sores -high blood pressure -no menstrual period for 6 weeks during use -pain, swelling, warmth in the leg -pelvic pain or tenderness -severe or sudden headache -signs of pregnancy -stomach cramping -sudden shortness of breath -trouble with balance, talking, or walking -unusual vaginal bleeding, discharge -yellowing of the eyes or skin Side effects that usually do not require medical attention (report to your doctor or health care professional if they continue or are bothersome): -acne -breast pain -change in sex drive or performance -changes in weight -cramping, dizziness, or faintness while the device is being inserted -headache -irregular menstrual bleeding within first 3 to 6 months of use -nausea This list may not describe all possible side effects. Call your doctor for medical advice about side effects. You may report side effects to FDA at 1-800-FDA-1088. Where should I keep my medicine? This does not apply. NOTE: This sheet is a summary. It may not cover all possible information. If you have questions about this medicine, talk to your doctor, pharmacist, or health care provider.  2015, [employer] Standard. (2011-04-10 13:54:04) Etonogestrel implant What is this medicine? ETONOGESTREL (et oh noe JES trel) is a contraceptive (birth control) device. It is used to prevent pregnancy. It can be used for up to 3 years. This medicine may be used for other purposes; ask your health care provider or pharmacist if you have  questions. COMMON BRAND NAME(S): Implanon, Nexplanon What  should I tell my health care provider before I take this medicine? They need to know if you have any of these conditions: -abnormal vaginal bleeding -blood vessel disease or blood clots -cancer of the breast, cervix, or liver -depression -diabetes -gallbladder disease -headaches -heart disease or recent heart attack -high blood pressure -high cholesterol -kidney disease -liver disease -renal disease -seizures -tobacco smoker -an unusual or allergic reaction to etonogestrel, other hormones, anesthetics or antiseptics, medicines, foods, dyes, or preservatives -pregnant or trying to get pregnant -breast-feeding How should I use this medicine? This device is inserted just under the skin on the inner side of your upper arm by a health care professional. Talk to your pediatrician regarding the use of this medicine in children. Special care may be needed. Overdosage: If you think you've taken too much of this medicine contact a poison control center or emergency room at once. Overdosage: If you think you have taken too much of this medicine contact a poison control center or emergency room at once. NOTE: This medicine is only for you. Do not share this medicine with others. What if I miss a dose? This does not apply. What may interact with this medicine? Do not take this medicine with any of the following medications: -amprenavir -bosentan -fosamprenavir This medicine may also interact with the following medications: -barbiturate medicines for inducing sleep or treating seizures -certain medicines for fungal infections like ketoconazole and itraconazole -griseofulvin -medicines to treat seizures like carbamazepine, felbamate, oxcarbazepine, phenytoin, topiramate -modafinil -phenylbutazone -rifampin -some medicines to treat HIV infection like atazanavir, indinavir, lopinavir, nelfinavir, tipranavir, ritonavir -St. John's wort This list may not describe all possible interactions.  Give your health care provider a list of all the medicines, herbs, non-prescription drugs, or dietary supplements you use. Also tell them if you smoke, drink alcohol, or use illegal drugs. Some items may interact with your medicine. What should I watch for while using this medicine? This product does not protect you against HIV infection (AIDS) or other sexually transmitted diseases. You should be able to feel the implant by pressing your fingertips over the skin where it was inserted. Tell your doctor if you cannot feel the implant. What side effects may I notice from receiving this medicine? Side effects that you should report to your doctor or health care professional as soon as possible: -allergic reactions like skin rash, itching or hives, swelling of the face, lips, or tongue -breast lumps -changes in vision -confusion, trouble speaking or understanding -dark urine -depressed mood -general ill feeling or flu-like symptoms -light-colored stools -loss of appetite, nausea -right upper belly pain -severe headaches -severe pain, swelling, or tenderness in the abdomen -shortness of breath, chest pain, swelling in a leg -signs of pregnancy -sudden numbness or weakness of the face, arm or leg -trouble walking, dizziness, loss of balance or coordination -unusual vaginal bleeding, discharge -unusually weak or tired -yellowing of the eyes or skin Side effects that usually do not require medical attention (Report these to your doctor or health care professional if they continue or are bothersome.): -acne -breast pain -changes in weight -cough -fever or chills -headache -irregular menstrual bleeding -itching, burning, and vaginal discharge -pain or difficulty passing urine -sore throat This list may not describe all possible side effects. Call your doctor for medical advice about side effects. You may report side effects to FDA at 1-800-FDA-1088. Where should I keep my medicine? This  drug is given in a hospital or  clinic and will not be stored at home. NOTE: This sheet is a summary. It may not cover all possible information. If you have questions about this medicine, talk to your doctor, pharmacist, or health care provider.  2015, [employer] Standard. (2011-09-15 15:37:45)

## 2014-07-12 NOTE — Progress Notes (Signed)
Christine Mccarthy 09-Nov-1990 [id number]    History:    Presents for annual exam. Irregular monthly cycles every 1-3 months/condoms/LMP in February/same partner. Tried Microgestin FE , experienced nausea/became emotional, self-discontinued. 2013 LSGIL, 2015 Pap normal. Gardasil series completed. Negative GC/Chlamydia 3/16.   Past medical history, past surgical history, family history and social history were all reviewed and documented in the EPIC chart. Senior at [employer], Radiation protection practitioner. 2 younger siblings, ages 41 and 52. Mother HTN.  ROS:  A ROS was performed and pertinent positives and negatives are included.  Exam:  Filed Vitals:   07/12/14 1015  BP: 124/80    General appearance:  Normal Thyroid:  Symmetrical, normal in size, without palpable masses or nodularity. Respiratory  Auscultation:  Clear without wheezing or rhonchi Cardiovascular  Auscultation:  Regular rate, without rubs, murmurs or gallops  Edema/varicosities:  Not grossly evident Abdominal  Soft,nontender, without masses, guarding or rebound.  Liver/spleen:  No organomegaly noted  Hernia:  None appreciated  Skin  Inspection:  Grossly normal   Breasts: Examined lying and sitting.     Right: Without masses, retractions, discharge or axillary adenopathy.     Left: Without masses, retractions, discharge or axillary adenopathy. Gentitourinary   Inguinal/mons:  Normal without inguinal adenopathy  External genitalia:  Normal  BUS/Urethra/Skene's glands:  Normal  Vagina:  Normal  Cervix:  Normal  Uterus:  Small in size, shape and contour.  Midline and mobile  Adnexa/parametria:     Rt: Without masses or tenderness.   Lt: Without masses or tenderness.  Anus and perineum: Normal  UPT negative  Assessment/Plan:  24 y.o. SBF G0 for annual exam.  Contraception management Irregular monthly cycle/condoms 2013 LGSIL, normal Pap 2015  Plan: Qualitative hCG, if negative  Provera 10 mg daily x 5 days. Reviewed options  for contraception, decided upon Mirena IUD, will check coverage, Dr. Phineas Real to place with next menses, risks of perforation, hemorrhage, and infection reviewed. Condoms encouraged until permanent partner. SBEs, MVI daily, calcium-rich diet, regular exercise encouraged. UA, CBC, hCG, TSH, prolactin. Reviewed campus and dating safety.   Huel Cote WHNP, 11:02 AM 07/12/2014

## 2014-07-13 LAB — URINALYSIS W MICROSCOPIC + REFLEX CULTURE
BACTERIA UA: NONE SEEN
Bilirubin Urine: NEGATIVE
CASTS: NONE SEEN
Crystals: NONE SEEN
Glucose, UA: NEGATIVE mg/dL
Hgb urine dipstick: NEGATIVE
KETONES UR: NEGATIVE mg/dL
Leukocytes, UA: NEGATIVE
Nitrite: NEGATIVE
PH: 6 (ref 5.0–8.0)
Protein, ur: NEGATIVE mg/dL
SQUAMOUS EPITHELIAL / LPF: NONE SEEN
Specific Gravity, Urine: 1.025 (ref 1.005–1.030)
UROBILINOGEN UA: 0.2 mg/dL (ref 0.0–1.0)

## 2014-07-13 LAB — PROLACTIN: Prolactin: 11.2 ng/mL

## 2014-07-13 LAB — TSH: TSH: 1.717 u[IU]/mL (ref 0.350–4.500)

## 2014-07-13 LAB — HCG, SERUM, QUALITATIVE: Preg, Serum: NEGATIVE

## 2014-07-25 ENCOUNTER — Telehealth: Payer: Self-pay | Admitting: *Deleted

## 2014-07-25 NOTE — Telephone Encounter (Signed)
Pt completed Provera 10 mg prescribed on 07/12/14 OV and told to call if no cycle. Please advise

## 2014-07-25 NOTE — Telephone Encounter (Signed)
Telephone call, states has had no bleeding or cramping after Provera. History of irregular cycles. Not sexually active in several months negative hCG prior to Provera. Will schedule Mirena IUD with Dr. Phineas Real, switched to appointments.

## 2014-08-10 ENCOUNTER — Telehealth: Payer: Self-pay | Admitting: *Deleted

## 2014-08-10 NOTE — Telephone Encounter (Signed)
(  pt aware you are out of the office) Pt called to follow up from telephone encounter 07/25/14. Pt said she is no longer interested in Mirena IUD or any birth control, no sexually active. She is however concerned about her cycle. [id number]. Please advise

## 2014-08-14 NOTE — Telephone Encounter (Signed)
Message left

## 2014-08-15 NOTE — Telephone Encounter (Signed)
Message left

## 2014-08-16 ENCOUNTER — Other Ambulatory Visit: Payer: Self-pay | Admitting: Women's Health

## 2014-08-16 MED ORDER — LEVONORGESTREL-ETHINYL ESTRAD 0.1-20 MG-MCG PO TABS: 1.0000 | ORAL_TABLET | Freq: Every day | ORAL | Status: DC

## 2014-08-16 NOTE — Telephone Encounter (Signed)
Telephone call, last cycle February, last intercourse March or April condom/same partner. Will check home UPT, if negative will watch start pills with next cycle Alesse prescription, proper use given and reviewed slight risk for blood clots and strokes and importance of having regular cycles. Condoms encouraged if sexually active. Instructed to call if no cycle on pills.

## 2014-08-31 ENCOUNTER — Other Ambulatory Visit: Payer: Self-pay | Admitting: Women's Health

## 2014-08-31 ENCOUNTER — Ambulatory Visit (INDEPENDENT_AMBULATORY_CARE_PROVIDER_SITE_OTHER): Payer: BLUE CROSS/BLUE SHIELD

## 2014-08-31 ENCOUNTER — Ambulatory Visit (INDEPENDENT_AMBULATORY_CARE_PROVIDER_SITE_OTHER): Payer: BLUE CROSS/BLUE SHIELD | Admitting: Women's Health

## 2014-08-31 ENCOUNTER — Encounter: Payer: Self-pay | Admitting: Women's Health

## 2014-08-31 DIAGNOSIS — R102 Pelvic and perineal pain: Secondary | ICD-10-CM

## 2014-08-31 DIAGNOSIS — N760 Acute vaginitis: Secondary | ICD-10-CM | POA: Diagnosis not present

## 2014-08-31 DIAGNOSIS — R1032 Left lower quadrant pain: Secondary | ICD-10-CM

## 2014-08-31 DIAGNOSIS — N912 Amenorrhea, unspecified: Secondary | ICD-10-CM | POA: Diagnosis not present

## 2014-08-31 DIAGNOSIS — N838 Other noninflammatory disorders of ovary, fallopian tube and broad ligament: Secondary | ICD-10-CM

## 2014-08-31 DIAGNOSIS — B9689 Other specified bacterial agents as the cause of diseases classified elsewhere: Secondary | ICD-10-CM

## 2014-08-31 DIAGNOSIS — R1030 Lower abdominal pain, unspecified: Secondary | ICD-10-CM | POA: Diagnosis not present

## 2014-08-31 DIAGNOSIS — A499 Bacterial infection, unspecified: Secondary | ICD-10-CM | POA: Diagnosis not present

## 2014-08-31 DIAGNOSIS — N839 Noninflammatory disorder of ovary, fallopian tube and broad ligament, unspecified: Secondary | ICD-10-CM | POA: Diagnosis not present

## 2014-08-31 DIAGNOSIS — N83201 Unspecified ovarian cyst, right side: Secondary | ICD-10-CM

## 2014-08-31 DIAGNOSIS — N83202 Unspecified ovarian cyst, left side: Secondary | ICD-10-CM

## 2014-08-31 DIAGNOSIS — Z36 Encounter for antenatal screening of mother: Secondary | ICD-10-CM

## 2014-08-31 DIAGNOSIS — N8320 Unspecified ovarian cysts: Secondary | ICD-10-CM

## 2014-08-31 DIAGNOSIS — O26891 Other specified pregnancy related conditions, first trimester: Secondary | ICD-10-CM

## 2014-08-31 DIAGNOSIS — Z3689 Encounter for other specified antenatal screening: Secondary | ICD-10-CM

## 2014-08-31 DIAGNOSIS — N949 Unspecified condition associated with female genital organs and menstrual cycle: Secondary | ICD-10-CM

## 2014-08-31 LAB — URINALYSIS W MICROSCOPIC + REFLEX CULTURE
CRYSTALS: NONE SEEN
Casts: NONE SEEN
Glucose, UA: NEGATIVE mg/dL
Hgb urine dipstick: NEGATIVE
Nitrite: NEGATIVE
PH: 7 (ref 5.0–8.0)
Protein, ur: 30 mg/dL — AB
RBC / HPF: NONE SEEN RBC/hpf (ref <3)
Specific Gravity, Urine: 1.02 (ref 1.005–1.030)
Urobilinogen, UA: 1 mg/dL (ref 0.0–1.0)

## 2014-08-31 LAB — PREGNANCY, URINE: Preg Test, Ur: POSITIVE

## 2014-08-31 MED ORDER — METRONIDAZOLE 0.75 % VA GEL: VAGINAL | Status: DC

## 2014-08-31 NOTE — Progress Notes (Addendum)
Patient ID: Christine Mccarthy, female   DOB: 12-31-1990, 24 y.o.   MRN: [id number] Presents with left lower quadrant pain for the past month but has increased today, history of irregular periods. Had a negative qualitative hCG 07/12/2014. LMP end of February, did not withdraw after Provera in April. Negative STD screen/same partner/condoms. States has had slight increased vaginal discharge with odor, no urinary symptoms or fever.  Exam: Appears mildly uncomfortable. UPT positive, distraught over + UPT/tearful. Unsure dates. Ultrasound: T/V and T/A anteverted uterus. Living IUP seen, fetal pole and fetal heart motion 164 bpm. Anterior placental low lying at cervix. Cervix long and closed. 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 Left ovary not ID. Left adnexal mass 2 solid cystic calcifications avascular 12 x 8 x 10 cm and 39 x 29 mm. Negative cul-de-sac. 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 Reviewed we do not do terminations or pregnancy care. Reviewed will call tomorrow to discuss plans. metrogel vaginal cream at bedtime 5 call if no relief of discharge.  Urine culture pending

## 2014-08-31 NOTE — Patient Instructions (Signed)
First Trimester of Pregnancy The first trimester of pregnancy is from week 1 until the end of week 12 (months 1 through 3). A week after a sperm fertilizes an egg, the egg will implant on the wall of the uterus. This embryo will begin to develop into a baby. Genes from you and your partner are forming the baby. The female genes determine whether the baby is a boy or a girl. At 6-8 weeks, the eyes and face are formed, and the heartbeat can be seen on ultrasound. At the end of 12 weeks, all the baby's organs are formed.  Now that you are pregnant, you will want to do everything you can to have a healthy baby. Two of the most important things are to get good prenatal care and to follow your health care provider's instructions. Prenatal care is all the medical care you receive before the baby's birth. This care will help prevent, find, and treat any problems during the pregnancy and childbirth. BODY CHANGES Your body goes through many changes during pregnancy. The changes vary from woman to woman.   You may gain or lose a couple of pounds at first.  You may feel sick to your stomach (nauseous) and throw up (vomit). If the vomiting is uncontrollable, call your health care provider.  You may tire easily.  You may develop headaches that can be relieved by medicines approved by your health care provider.  You may urinate more often. Painful urination may mean you have a bladder infection.  You may develop heartburn as a result of your pregnancy.  You may develop constipation because certain hormones are causing the muscles that push waste through your intestines to slow down.  You may develop hemorrhoids or swollen, bulging veins (varicose veins).  Your breasts may begin to grow larger and become tender. Your nipples may stick out more, and the tissue that surrounds them (areola) may become darker.  Your gums may bleed and may be sensitive to brushing and flossing.  Dark spots or blotches (chloasma,  mask of pregnancy) may develop on your face. This will likely fade after the baby is born.  Your menstrual periods will stop.  You may have a loss of appetite.  You may develop cravings for certain kinds of food.  You may have changes in your emotions from day to day, such as being excited to be pregnant or being concerned that something may go wrong with the pregnancy and baby.  You may have more vivid and strange dreams.  You may have changes in your hair. These can include thickening of your hair, rapid growth, and changes in texture. Some women also have hair loss during or after pregnancy, or hair that feels dry or thin. Your hair will most likely return to normal after your baby is born. WHAT TO EXPECT AT YOUR PRENATAL VISITS During a routine prenatal visit:  You will be weighed to make sure you and the baby are growing normally.  Your blood pressure will be taken.  Your abdomen will be measured to track your baby's growth.  The fetal heartbeat will be listened to starting around week 10 or 12 of your pregnancy.  Test results from any previous visits will be discussed. Your health care provider may ask you:  How you are feeling.  If you are feeling the baby move.  If you have had any abnormal symptoms, such as leaking fluid, bleeding, severe headaches, or abdominal cramping.  If you have any questions. Other tests   that may be performed during your first trimester include:  Blood tests to find your blood type and to check for the presence of any previous infections. They will also be used to check for low iron levels (anemia) and Rh antibodies. Later in the pregnancy, blood tests for diabetes will be done along with other tests if problems develop.  Urine tests to check for infections, diabetes, or protein in the urine.  An ultrasound to confirm the proper growth and development of the baby.  An amniocentesis to check for possible genetic problems.  Fetal screens for  spina bifida and Down syndrome.  You may need other tests to make sure you and the baby are doing well. HOME CARE INSTRUCTIONS  Medicines  Follow your health care provider's instructions regarding medicine use. Specific medicines may be either safe or unsafe to take during pregnancy.  Take your prenatal vitamins as directed.  If you develop constipation, try taking a stool softener if your health care provider approves. Diet  Eat regular, well-balanced meals. Choose a variety of foods, such as meat or vegetable-based protein, fish, milk and low-fat dairy products, vegetables, fruits, and whole grain breads and cereals. Your health care provider will help you determine the amount of weight gain that is right for you.  Avoid raw meat and uncooked cheese. These carry germs that can cause birth defects in the baby.  Eating four or five small meals rather than three large meals a day may help relieve nausea and vomiting. If you start to feel nauseous, eating a few soda crackers can be helpful. Drinking liquids between meals instead of during meals also seems to help nausea and vomiting.  If you develop constipation, eat more high-fiber foods, such as fresh vegetables or fruit and whole grains. Drink enough fluids to keep your urine clear or pale yellow. Activity and Exercise  Exercise only as directed by your health care provider. Exercising will help you:  Control your weight.  Stay in shape.  Be prepared for labor and delivery.  Experiencing pain or cramping in the lower abdomen or low back is a good sign that you should stop exercising. Check with your health care provider before continuing normal exercises.  Try to avoid standing for long periods of time. Move your legs often if you must stand in one place for a long time.  Avoid heavy lifting.  Wear low-heeled shoes, and practice good posture.  You may continue to have sex unless your health care provider directs you  otherwise. Relief of Pain or Discomfort  Wear a good support bra for breast tenderness.   Take warm sitz baths to soothe any pain or discomfort caused by hemorrhoids. Use hemorrhoid cream if your health care provider approves.   Rest with your legs elevated if you have leg cramps or low back pain.  If you develop varicose veins in your legs, wear support hose. Elevate your feet for 15 minutes, 3-4 times a day. Limit salt in your diet. Prenatal Care  Schedule your prenatal visits by the twelfth week of pregnancy. They are usually scheduled monthly at first, then more often in the last 2 months before delivery.  Write down your questions. Take them to your prenatal visits.  Keep all your prenatal visits as directed by your health care provider. Safety  Wear your seat belt at all times when driving.  Make a list of emergency phone numbers, including numbers for family, friends, the hospital, and police and fire departments. General Tips    Ask your health care provider for a referral to a local prenatal education class. Begin classes no later than at the beginning of month 6 of your pregnancy.  Ask for help if you have counseling or nutritional needs during pregnancy. Your health care provider can offer advice or refer you to specialists for help with various needs.  Do not use hot tubs, steam rooms, or saunas.  Do not douche or use tampons or scented sanitary pads.  Do not cross your legs for long periods of time.  Avoid cat litter boxes and soil used by cats. These carry germs that can cause birth defects in the baby and possibly loss of the fetus by miscarriage or stillbirth.  Avoid all smoking, herbs, alcohol, and medicines not prescribed by your health care provider. Chemicals in these affect the formation and growth of the baby.  Schedule a dentist appointment. At home, brush your teeth with a soft toothbrush and be gentle when you floss. SEEK MEDICAL CARE IF:   You have  dizziness.  You have mild pelvic cramps, pelvic pressure, or nagging pain in the abdominal area.  You have persistent nausea, vomiting, or diarrhea.  You have a bad smelling vaginal discharge.  You have pain with urination.  You notice increased swelling in your face, hands, legs, or ankles. SEEK IMMEDIATE MEDICAL CARE IF:   You have a fever.  You are leaking fluid from your vagina.  You have spotting or bleeding from your vagina.  You have severe abdominal cramping or pain.  You have rapid weight gain or loss.  You vomit blood or material that looks like coffee grounds.  You are exposed to German measles and have never had them.  You are exposed to fifth disease or chickenpox.  You develop a severe headache.  You have shortness of breath.  You have any kind of trauma, such as from a fall or a car accident. Document Released: 03/04/2001 Document Revised: 07/25/2013 Document Reviewed: 01/18/2013 [employer] Patient Information 2015 [employer]. This information is not intended to replace advice given to you by your health care provider. Make sure you discuss any questions you have with your health care provider.  

## 2014-09-01 LAB — URINE CULTURE
COLONY COUNT: NO GROWTH
ORGANISM ID, BACTERIA: NO GROWTH

## 2014-09-12 ENCOUNTER — Inpatient Hospital Stay (HOSPITAL_COMMUNITY)
Admission: AD | Admit: 2014-09-12 | Discharge: 2014-09-12 | Disposition: A | Discharge status: Home / Self Care | Payer: BLUE CROSS/BLUE SHIELD | Source: Ambulatory Visit | Attending: Obstetrics & Gynecology | Admitting: Obstetrics & Gynecology

## 2014-09-12 ENCOUNTER — Encounter (HOSPITAL_COMMUNITY): Payer: Self-pay

## 2014-09-12 DIAGNOSIS — Z3A12 12 weeks gestation of pregnancy: Secondary | ICD-10-CM | POA: Diagnosis not present

## 2014-09-12 DIAGNOSIS — D271 Benign neoplasm of left ovary: Secondary | ICD-10-CM | POA: Insufficient documentation

## 2014-09-12 DIAGNOSIS — D270 Benign neoplasm of right ovary: Secondary | ICD-10-CM | POA: Diagnosis not present

## 2014-09-12 DIAGNOSIS — N83209 Unspecified ovarian cyst, unspecified side: Secondary | ICD-10-CM

## 2014-09-12 DIAGNOSIS — N8320 Unspecified ovarian cysts: Secondary | ICD-10-CM

## 2014-09-12 DIAGNOSIS — O3481 Maternal care for other abnormalities of pelvic organs, first trimester: Secondary | ICD-10-CM | POA: Diagnosis not present

## 2014-09-12 DIAGNOSIS — R1030 Lower abdominal pain, unspecified: Secondary | ICD-10-CM | POA: Diagnosis present

## 2014-09-12 HISTORY — DX: Other specified health status: Z78.9

## 2014-09-12 LAB — URINALYSIS, ROUTINE W REFLEX MICROSCOPIC
Bilirubin Urine: NEGATIVE
GLUCOSE, UA: NEGATIVE mg/dL
HGB URINE DIPSTICK: NEGATIVE
KETONES UR: 15 mg/dL — AB
LEUKOCYTES UA: NEGATIVE
Nitrite: NEGATIVE
Protein, ur: NEGATIVE mg/dL
Specific Gravity, Urine: >1.03 — ABNORMAL HIGH (ref 1.005–1.030)
Urobilinogen, UA: 2 mg/dL — ABNORMAL HIGH (ref 0.0–1.0)
pH: 6 (ref 5.0–8.0)

## 2014-09-12 MED ORDER — OXYCODONE-ACETAMINOPHEN 5-325 MG PO TABS: 1.0000 | ORAL_TABLET | Freq: Four times a day (QID) | ORAL | Status: DC | PRN

## 2014-09-12 NOTE — Discharge Instructions (Signed)
Ovarian Cystectomy Ovarian cystectomy is surgery to remove a fluid-filled sac (cyst) on an ovary. The ovaries are small organs that produce eggs in women. Various types of cysts can form on the ovaries. Most are not cancerous. Surgery may be done if a cyst is large or is causing symptoms such as pain. It may also be done for a cyst that is or might be cancerous. This surgery can be done using a laparoscopic technique or an open abdominal technique. The laparoscopic technique involves smaller cuts (incisions) and a faster recovery time. The technique used will depend on your age, the type of cyst, and whether the cyst is cancerous. The laparoscopic technique is not used for a cancerous cyst. LET [facility] CARE PROVIDER KNOW ABOUT:   Any allergies you have.  All medicines you are taking, including vitamins, herbs, eye drops, creams, and over-the-counter medicines.  Previous problems you or members of your family have had with the use of anesthetics.  Any blood disorders you have.  Previous surgeries you have had.  Medical conditions you have.  Any chance you might be pregnant. RISKS AND COMPLICATIONS Generally, this is a safe procedure. However, as with any procedure, complications can occur. Possible complications include:  Excessive bleeding.  Infection.  Injury to other organs.  Blood clots.  Becoming incapable of getting pregnant (infertile). BEFORE THE PROCEDURE  Ask your health care provider about changing or stopping any regular medicines. Avoid taking aspirin, ibuprofen, or blood thinners as directed by your health care provider.  Do not eat or drink anything after midnight the night before surgery.  If you smoke, do not smoke for at least 2 weeks before your surgery.  Do not drink alcohol the day before your surgery.  Let your health care provider know if you develop a cold or any infection before your surgery.  Arrange for someone to drive you home after the  procedure or after your hospital stay. Also arrange for someone to help you with activities during recovery. PROCEDURE  Either a laparoscopic technique or an open abdominal technique may be used for this surgery.  Small monitors will be put on your body. They are used to check your heart, blood pressure, and oxygen level.   An IV access tube will be put into one of your veins. Medicine will be able to flow directly into your body through this IV tube.   You might be given a medicine to help you relax (sedative).   You will be given a medicine to make you sleep (general anesthetic). A breathing tube may be placed into your lungs during the procedure. Laparoscopic Technique  Several small cuts (incisions) are made in your abdomen. These are typically about 1 to 2 cm long.   Your abdomen will be filled with carbon dioxide gas so that it expands. This gives the surgeon more room to operate and makes your organs easier to see.   A thin, lighted tube with a tiny camera on the end (laparoscope) is put through one of the small incisions. The camera on the laparoscope sends a picture to a TV screen in the operating room. This gives the surgeon a good view inside your abdomen.   Hollow tubes are put through the other small incisions in your abdomen. The tools needed for the procedure are put through these tubes.  The ovary with the cyst is identified, and the cyst is removed. It is sent to the lab for testing. If it is cancer, both ovaries  may need to be removed during a different surgery.  Tools are removed. The incisions are then closed with stitches or skin glue, and dressings may be applied. Open Abdominal Technique  A single large incision is made along your bikini line or in the middle of your lower abdomen.  The ovary with the cyst is identified, and the cyst is removed. It is sent to the lab for testing. If it is cancer, both ovaries may need to be removed during a different  surgery.  The incision is then closed with stitches or staples. AFTER THE PROCEDURE   You will wake up from anesthesia and be taken to a recovery area.  If you had laparoscopic surgery, you may be able to go home the same day, or you may need to stay in the hospital overnight.  If you had open abdominal surgery, you will need to stay in the hospital for a few days.  Your IV access tube and catheter will be removed the first or second day, after you are able to eat and drink enough.  You may be given medicine to relieve pain or to help you sleep.  You may be given an antibiotic medicine if needed. Document Released: 01/05/2007 Document Revised: 12/29/2012 Document Reviewed: 10/20/2012 [facility] Patient Information 2015 [place], [place]. This information is not intended to replace advice given to you by your health care provider. Make sure you discuss any questions you have with your health care provider.  Ovarian Cyst An ovarian cyst is a sac filled with fluid or blood. This sac is attached to the ovary. Some cysts go away on their own. Other cysts need treatment.  HOME CARE   Only take medicine as told by your doctor.  Follow up with your doctor as told.  Get regular pelvic exams and Pap tests. GET HELP IF:  Your periods are late, not regular, or painful.  You stop having periods.  Your belly (abdominal) or pelvic pain does not go away.  Your belly becomes large or puffy (swollen).  You have a hard time peeing (totally emptying your bladder).  You have pressure on your bladder.  You have pain during sex.  You feel fullness, pressure, or discomfort in your belly.  You lose weight for no reason.  You feel sick most of the time.  You have a hard time pooping (constipation).  You do not feel like eating.  You develop pimples (acne).  You have an increase in hair on your body and face.  You are gaining weight for no reason.  You think you are pregnant. GET HELP  RIGHT AWAY IF:   Your belly pain gets worse.  You feel sick to your stomach (nauseous), and you throw up (vomit).  You have a fever that comes on fast.  You have belly pain while pooping (bowel movement).  Your periods are heavier than usual. MAKE SURE YOU:   Understand these instructions.  Will watch your condition.  Will get help right away if you are not doing well or get worse. Document Released: 08/27/2007 Document Revised: 12/29/2012 Document Reviewed: 11/15/2012 [facility] Patient Information 2015 [place], [place]. This information is not intended to replace advice given to you by your health care provider. Make sure you discuss any questions you have with your health care provider.

## 2014-09-12 NOTE — MAU Provider Note (Signed)
History     CSN: [id number]  Arrival date and time: 09/12/14 2029   First Provider Initiated Contact with Patient 09/12/14 2057      Chief Complaint  Patient presents with  . Abdominal Pain    left side   HPI  Ms. Christine Mccarthy is a 24 y.o. G1P0000 at [redacted]w[redacted]d who presents to MAU today with complaint of lower abdominal pain. The patient states that she was at [facility] on 08/31/14 and diagnosed with bilateral ovarian cysts. She states continued abdominal pain similar to that time. She has not taken anything for pain. She states LLQ worse than RLQ. She is upset because she is unsure about the future of the pregnancy based on what she was told by the NP on 08/31/14. She states frequent persistent nausea, but denies vomiting or diarrhea. She also states occasional mild constipation recently. She denies vaginal bleeding or discharge. She has an appointment planned to start prenatal care with [facility] on Thursday of this week.   OB History    Gravida Para Term Preterm AB TAB SAB Ectopic Multiple Living   1    0   0        Past Medical History  Diagnosis Date  . Medical history non-contributory     Past Surgical History  Procedure Laterality Date  . No past surgeries      Family History  Problem Relation Age of Onset  . Breast cancer Maternal Grandmother     History  Substance Use Topics  . Smoking status: Never Smoker   . Smokeless tobacco: Never Used  . Alcohol Use: No    Allergies: No Known Allergies  No prescriptions prior to admission    Review of Systems  Constitutional: Negative for fever and malaise/fatigue.  Gastrointestinal: Positive for nausea and abdominal pain. Negative for vomiting, diarrhea and constipation.  Genitourinary: Negative for dysuria, urgency and frequency.       Neg - vaginal bleeding, discharge   Physical Exam   Blood pressure 108/64, pulse 71, temperature 97.8 F (36.6 C), temperature source Oral, resp. rate 18, height 5\' 7"  (1.702 m),  weight 155 lb (70.308 kg), last menstrual period 05/13/2014.  Physical Exam  Nursing note and vitals reviewed. Constitutional: She is oriented to person, place, and time. She appears well-developed and well-nourished. No distress.  HENT:  Head: Normocephalic and atraumatic.  Cardiovascular: Normal rate.   Respiratory: Effort normal.  GI: Soft. She exhibits no distension. There is tenderness (mild tenderness to palpation in the RLQ and moderate in the LLQ). There is no rebound and no guarding.  Neurological: She is alert and oriented to person, place, and time.  Skin: Skin is warm and dry. No erythema.  Psychiatric: She has a normal mood and affect.   Results for orders placed or performed during the hospital encounter of 09/12/14 (from the past 24 hour(s))  Urinalysis, Routine w reflex microscopic (not at [facility])     Status: Abnormal   Collection Time: 09/12/14  8:40 PM  Result Value Ref Range   Color, Urine YELLOW YELLOW   APPearance CLEAR CLEAR   Specific Gravity, Urine >1.030 (H) 1.005 - 1.030   pH 6.0 5.0 - 8.0   Glucose, UA NEGATIVE NEGATIVE mg/dL   Hgb urine dipstick NEGATIVE NEGATIVE   Bilirubin Urine NEGATIVE NEGATIVE   Ketones, ur 15 (A) NEGATIVE mg/dL   Protein, ur NEGATIVE NEGATIVE mg/dL   Urobilinogen, UA 2.0 (H) 0.0 - 1.0 mg/dL   Nitrite NEGATIVE NEGATIVE  Leukocytes, UA NEGATIVE NEGATIVE    MAU Course  Procedures None  MDM FHR - 164 bpm with doppler Discussed with Dr. Sabra Heck. Agrees with plan to provide patient with Rx for Pecocet for pain management and follow-up with [facility]. Dr. Sabra Heck will call [facility] tomorrow to inform them of the patient's diagnosis and need for urgent follow-up Assessment and Plan  A: SIUP at [redacted]w[redacted]d Bilateral large dermoid ovarian cysts  P: Discharge home Rx for Percocet given to patient First trimester precautions discussed Patient advised to follow-up with [facility] as scheduled or sooner if possible Warning signs for ovarian torsion  discussed Patient may return to MAU as needed or if her condition were to change or worsen   Luvenia Redden, PA-C  09/12/2014, 9:57 PM

## 2014-09-12 NOTE — MAU Note (Signed)
Found out pregnant two weeks ago and found out has two large cysts on outside of left ovary. Onset of severe pain since 0400 am pain 7/10. Denies vaginal bleeding no discharge, was prescribed a gel for BV.

## 2014-09-12 NOTE — Progress Notes (Signed)
Patient's first appointment with [facility] is this Thursday, she called office today hoping to be seen but they were unable to see him.

## 2014-09-29 LAB — OB RESULTS CONSOLE RUBELLA ANTIBODY, IGM: RUBELLA: IMMUNE

## 2014-09-29 LAB — OB RESULTS CONSOLE RPR: RPR: NONREACTIVE

## 2014-09-29 LAB — OB RESULTS CONSOLE HIV ANTIBODY (ROUTINE TESTING): HIV: NONREACTIVE

## 2014-09-29 LAB — OB RESULTS CONSOLE GC/CHLAMYDIA
Chlamydia: NEGATIVE
Gonorrhea: POSITIVE

## 2014-10-05 LAB — OB RESULTS CONSOLE ABO/RH: RH TYPE: POSITIVE

## 2014-10-05 LAB — OB RESULTS CONSOLE ANTIBODY SCREEN: ANTIBODY SCREEN: NEGATIVE

## 2015-02-21 LAB — OB RESULTS CONSOLE GBS: GBS: NEGATIVE

## 2015-03-25 NOTE — L&D Delivery Note (Addendum)
Vaginal Delivery Note The pt utilized an epidural as pain management.   Spontaneous rupture of membranes today, at 1142, clear.  GBS was negative.  Cervical dilation was complete at  1142.  NICHD Category 2.    Pushing with guidance began at 1156.   After 48 minutes of pushing the head, shoulders and the body of a viable female infant "Alanah" delivered spontaneously with maternal effort in the ROT position at 1244.    Infant with flaccid tone, no grimace, dusky color, the infant was placed on moms abd. The cord was clamped, cut and the infant was moved to the warmer to the nursing team for assessment.  The rapid response team was call to the room for further assessment.  See NICU notes for additional information.   Spontaneous delivery of a intact placenta with a 3 vessel cord via [place] [facility] [place].   Episiotomy: None   The vulva, perineum, vaginal vault, rectum and cervix were inspected and revealed a 2 degree vaginal and a 1st degreeperineal, both repaired using a 3-0 vicryl on a CT needle.  Lidocaine was not used, the epidural was sufficient for the repair. Patient tolerated repair well.   Postpartum pitocin as ordered.   Uterus boggy immediately after delivery of placenta - fundal massage and 1000 mcg of Cytotec PR.   EBL 258, Pt hemodynamically stable.   Sponge, laps and needle count correct and verified with the primary care nurse.  Attending MD available at all times.    Routine postpartum orders   Mother unsure about method of contraception  Mom plans to breastfeed  Placenta to pathology: NO     Cord Gases sent to lab: YES Cord blood sent to lab: YES   APGARS:  4 at 1 minute and 9 at 5 minutes Weight:. 7lb 13.6oz     Both mom and baby were left in stable condition, baby skin to skin.      Khaleef Ruby, CNM, MSN 04/01/2015. 3:07 PM

## 2015-03-31 ENCOUNTER — Inpatient Hospital Stay (HOSPITAL_COMMUNITY)
Admission: AD | Admit: 2015-03-31 | Discharge: 2015-04-03 | DRG: 775 | Disposition: A | Discharge status: Home / Self Care | Payer: Medicaid Other | Source: Ambulatory Visit | Attending: Obstetrics and Gynecology | Admitting: Obstetrics and Gynecology

## 2015-03-31 ENCOUNTER — Inpatient Hospital Stay (HOSPITAL_COMMUNITY): Payer: Medicaid Other

## 2015-03-31 ENCOUNTER — Encounter (HOSPITAL_COMMUNITY): Payer: Self-pay | Admitting: *Deleted

## 2015-03-31 DIAGNOSIS — Z3A40 40 weeks gestation of pregnancy: Secondary | ICD-10-CM

## 2015-03-31 DIAGNOSIS — D649 Anemia, unspecified: Secondary | ICD-10-CM | POA: Diagnosis present

## 2015-03-31 DIAGNOSIS — O480 Post-term pregnancy: Principal | ICD-10-CM | POA: Diagnosis present

## 2015-03-31 DIAGNOSIS — Z803 Family history of malignant neoplasm of breast: Secondary | ICD-10-CM

## 2015-03-31 DIAGNOSIS — O9902 Anemia complicating childbirth: Secondary | ICD-10-CM | POA: Diagnosis present

## 2015-03-31 DIAGNOSIS — IMO0001 Reserved for inherently not codable concepts without codable children: Secondary | ICD-10-CM

## 2015-03-31 LAB — CBC
HEMATOCRIT: 33.4 % — AB (ref 36.0–46.0)
HEMOGLOBIN: 10.8 g/dL — AB (ref 12.0–15.0)
MCH: 26.7 pg (ref 26.0–34.0)
MCHC: 32.3 g/dL (ref 30.0–36.0)
MCV: 82.7 fL (ref 78.0–100.0)
Platelets: 293 10*3/uL (ref 150–400)
RBC: 4.04 MIL/uL (ref 3.87–5.11)
RDW: 14.2 % (ref 11.5–15.5)
WBC: 9.8 10*3/uL (ref 4.0–10.5)

## 2015-03-31 LAB — ABO/RH: ABO/RH(D): O POS

## 2015-03-31 LAB — AMNISURE RUPTURE OF MEMBRANE (ROM) NOT AT ARMC: AMNISURE: NEGATIVE

## 2015-03-31 LAB — TYPE AND SCREEN
ABO/RH(D): O POS
Antibody Screen: NEGATIVE

## 2015-03-31 MED ORDER — CITRIC ACID-SODIUM CITRATE 334-500 MG/5ML PO SOLN: 30.0000 mL | ORAL | Status: DC | PRN

## 2015-03-31 MED ORDER — OXYCODONE-ACETAMINOPHEN 5-325 MG PO TABS: 2.0000 | ORAL_TABLET | ORAL | Status: DC | PRN

## 2015-03-31 MED ORDER — OXYTOCIN 10 UNIT/ML IJ SOLN
2.5000 [IU]/h | INTRAVENOUS | Status: DC
  Filled 2015-03-31: qty 4

## 2015-03-31 MED ORDER — ACETAMINOPHEN 325 MG PO TABS: 650.0000 mg | ORAL_TABLET | ORAL | Status: DC | PRN

## 2015-03-31 MED ORDER — TERBUTALINE SULFATE 1 MG/ML IJ SOLN
0.2500 mg | Freq: Once | INTRAMUSCULAR | Status: DC | PRN
Start: 2015-03-31 — End: 2015-04-01
  Filled 2015-03-31: qty 1

## 2015-03-31 MED ORDER — ONDANSETRON HCL 4 MG/2ML IJ SOLN
4.0000 mg | Freq: Four times a day (QID) | INTRAMUSCULAR | Status: DC | PRN
Start: 2015-03-31 — End: 2015-04-01

## 2015-03-31 MED ORDER — NALBUPHINE HCL 10 MG/ML IJ SOLN
5.0000 mg | INTRAMUSCULAR | Status: DC | PRN
  Administered 2015-04-01 (×2): 5 mg via INTRAVENOUS
  Filled 2015-03-31 (×2): qty 1

## 2015-03-31 MED ORDER — LACTATED RINGERS IV SOLN
500.0000 mL | INTRAVENOUS | Status: DC | PRN
Start: 2015-03-31 — End: 2015-04-01

## 2015-03-31 MED ORDER — FLEET ENEMA 7-19 GM/118ML RE ENEM: 1.0000 | ENEMA | Freq: Every day | RECTAL | Status: DC | PRN

## 2015-03-31 MED ORDER — LACTATED RINGERS IV SOLN
INTRAVENOUS | Status: DC
  Administered 2015-03-31 – 2015-04-01 (×3): via INTRAVENOUS

## 2015-03-31 MED ORDER — LIDOCAINE HCL (PF) 1 % IJ SOLN
30.0000 mL | INTRAMUSCULAR | Status: DC | PRN
  Filled 2015-03-31: qty 30

## 2015-03-31 MED ORDER — OXYTOCIN BOLUS FROM INFUSION
500.0000 mL | INTRAVENOUS | Status: DC
  Administered 2015-04-01: 500 mL via INTRAVENOUS

## 2015-03-31 MED ORDER — OXYCODONE-ACETAMINOPHEN 5-325 MG PO TABS: 1.0000 | ORAL_TABLET | ORAL | Status: DC | PRN

## 2015-03-31 NOTE — Progress Notes (Signed)
Venus Standard, CNM notified of pt arrival to MAU.  Provider put in order for an amnisure.

## 2015-03-31 NOTE — Progress Notes (Signed)
Christine Mccarthy, CNM notified of negative amnisure results.  States she will come see the pt.

## 2015-03-31 NOTE — MAU Note (Signed)
Pt states she thinks her water broke about 2 days ago.  Pt states she is changing pads about 3 times a day.  Pt states she is feeling the baby move.  Pt states she is experiencing pressure and cramps.

## 2015-03-31 NOTE — Progress Notes (Signed)
Subjective: In to meet the acquaintance of Pt and family.  Pt standing at bedside  Off the monitor "getting settled".   Placed back on the monitor.    Objective: BP 124/71 mmHg  Pulse 67  Temp(Src) 98.6 F (37 C) (Oral)  Resp 20  Ht 5\' 7"  (1.702 m)  Wt 83.462 kg (184 lb)  BMI 28.81 kg/m2  LMP 05/13/2014      FHT: Category 1, 140 bpm, moderate variability, +accels, no decels  UC:   irregular, every 2-5 minutes,  SVE:   Dilation: 1.5 Effacement (%): 70 Station: -2 Exam by:: R. Maela Takeda, CNM  Membranes: Intact Foley Bulb with 57ml LR inserted without difficulty- taped with light traction to left leg.  Pitocin: None  Assessment:  IUP at 40.4 wks Latent labor Membranes Intact Foley Bulb placed @ 2020 Cat 1 FHT GBB negative  Plan: Discussed labor progression and induction process.  Check foley bulb placement every hr- notify CNM when FB is out  Regular diet prior to starting pitocin Clear/Thin diet after pitocin starts IV pain medication per orders PRN Epidural per patient request Foley cath after patient is comfortable with epidural Anticipate SVD Attending MD available at all times.    [place], [place] 03/31/2015, 8:23 PM

## 2015-03-31 NOTE — MAU Provider Note (Signed)
Christine Mccarthy is a 25 y.o. G1P0 at 40.4 weeks pt called earlier today to report LOF x 2 days.  She denies ctx which is why she did call.  She states she is GBS negative.   History     Patient Active Problem List   Diagnosis Date Noted  . Bilateral ovarian cysts 08/31/2014  . Bacterial vaginosis 07/06/2013    No chief complaint on file.  HPI  OB History    Gravida Para Term Preterm AB TAB SAB Ectopic Multiple Living   1    0   0        Past Medical History  Diagnosis Date  . Medical history non-contributory     Past Surgical History  Procedure Laterality Date  . No past surgeries      Family History  Problem Relation Age of Onset  . Breast cancer Maternal Grandmother     Social History  Substance Use Topics  . Smoking status: Never Smoker   . Smokeless tobacco: Never Used  . Alcohol Use: No    Allergies: No Known Allergies  Prescriptions prior to admission  Medication Sig Dispense Refill Last Dose  . metroNIDAZOLE (METROGEL VAGINAL) 0.75 % vaginal gel 1 applicator per vagina at HS x 5 (Patient taking differently: Place 1 Applicatorful vaginally at bedtime. 1 applicator per vagina at HS x 5 days, started 6/20) 70 g 0 09/11/2014 at Unknown time  . oxyCODONE-acetaminophen (PERCOCET/ROXICET) 5-325 MG per tablet Take 1-2 tablets by mouth every 6 (six) hours as needed for severe pain. 20 tablet 0     ROS See HPI above, all other systems are negative  Physical Exam   Last menstrual period 05/13/2014.  Physical Exam Ext:  WNL ABD: Soft, non tender to palpation, no rebound or guarding SVE: 1-2/50/-2   ED Course  Assessment: IUP at  40.4 weeks Membranes: questionable FHR: 140 min-moderate variability, + accel, occasional variable decels CTX:  occassional   Plan: Amnisure OB US  Annia Gomm, CNM, MSN 03/31/2015. 2:26 PM    MAU Addendum Note  Results for orders placed or performed during the hospital encounter of 03/31/15 (from the past 24  hour(s))  Amnisure rupture of membrane (rom)not at [facility]     Status: None   Collection Time: 03/31/15  2:42 PM  Result Value Ref Range   Amnisure ROM NEGATIVE    FHT 145, moderate variability, + accel, late decel after membrane stripping.  Plan: Admit to L&D Consulted with Dr. Isac Sarna Destenie Ingber, CNM, MSN 03/31/2015. 5:27 PM

## 2015-03-31 NOTE — H&P (Signed)
Christine Mccarthy is a 25 y.o. female, G1 P0 at 40.4 weeks  Patient Active Problem List   Diagnosis Date Noted  . Bilateral ovarian cysts 08/31/2014  . Bacterial vaginosis 07/06/2013    Pregnancy Course: Patient entered care at 14.3 weeks.   EDC of 03/27/15 was established by [place].      [place] evaluations:   19.1 weeks - Anatomy:Anatomy U/S: EFW: 10 oz +/- 2 oz 66%tile cx 3.51 cm cx closed Anterior placenta. No previa. Placenta edge to cx= 3.4 cm. Placental cord insertion seen. NL fluid. AP pocket =4.1 cm. Anatomy: open hands, 5th digit, AA all seen. Anatomy not seen: 4 ch, RVOT, LVOT, DA. Pelvic US- RTOV: seen- adjacent to RO is a solid appearing mass with cystic component- avascular - measures: 5.4 cm x 3.6 cm x 4.3 cm. 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 LTOV- two masses are seen: smaller one= 3.2 cm x 1.8 cm x 2.7 cm- hyperechoic area debris strands some calcification- measures 8.5 cm x 5.6 cm x 7.3 cm       23.1 weeks - FU: Cervix measures 4.3 cm. Growth equals 79th percentile. Heart rate 153 bpm. Breech presentation. Four-chamber heart noted. Ductal arch not well seen. A 5.5 x 3.8 cm solid appearing mass in the right adnexa. 9.0 x 8.9 cm mass in the left adnexa with both cystic and solid components.    27.1 weeks - FU: EFW 42%, AFI13, CL 3.32, RIGHT DERMOID 6.7CM (WAS 5.5 CM) , 10.4 CM (9.0 CM), SUPERIOR TO LEFT 4CM (WAS 3.1 CM BEFORE),    36.2 weeks FU: BPP 8/8, CEPHALIC RIGHT ADNEXAL MASS 5CM, LEFT SUPERIOR MASS 11 CM, LEFT INFERIOR MASS 4CM. EFW AND AFI NORMAL  40.4 weeks in MAU BPP 6/8 total 6/10, AFI 7.76   Significant prenatal events:   GC positive, neg TOC 11/07/14   Last evaluation:   40.4 weeks   In MAU   Reason for admission:  Post dates  Pt States:   Contractions Frequency: 4-8         Contraction severity: unaware         Fetal activity: +FM  OB History    Gravida Para Term Preterm AB TAB SAB Ectopic Multiple Living   1    0   0       Past Medical History   Diagnosis Date  . Medical history non-contributory    Past Surgical History  Procedure Laterality Date  . No past surgeries     Family History: family history includes Breast cancer in her maternal grandmother. Social History:  reports that she has never smoked. She has never used smokeless tobacco. She reports that she does not drink alcohol or use illicit drugs.   Prenatal Transfer Tool  Maternal Diabetes: No Genetic Screening: Declined Maternal Ultrasounds/Referrals: Normal Fetal Ultrasounds or other Referrals:  None Maternal Substance Abuse:  No Significant Maternal Medications:  None Significant Maternal Lab Results: None   ROS:  See HPI above, all other systems are negative  No Known Allergies  Dilation: 1.5 Effacement (%): 50 Station: -2 Exam by:: Christine Mccarthy, CNM Blood pressure 118/69, pulse 93, temperature 98.4 F (36.9 C), temperature source Oral, resp. rate 18, last menstrual period 05/13/2014.  Maternal Exam:  Uterine Assessment: Contraction frequency is rare.  Abdomen: Gravid, non tender. Fundal height is aga.  Normal external genitalia, vulva, cervix, uterus and adnexa.  No lesions noted on exam.  Pelvis adequate for delivery.  Fetal presentation: Vertex by VE  Fetal Exam:  Monitor Surveillance : Continuous Monitoring Mode: Ultrasound.  NICHD: Category 2 CTXs: occasional  EFW 7   lbs  Physical Exam: Nursing note and vitals reviewed General: alert and cooperative She appears well nourished Psychiatric: Normal mood and affect. Her behavior is normal Head: Normocephalic Eyes: Pupils are equal, round, and reactive to light Neck: Normal range of motion Cardiovascular: RRR without murmur  Respiratory: CTAB. Effort normal  Abd: soft, non-tender, +BS, no rebound, no guarding  Genitourinary: Vagina normal  Neurological: A&Ox3 Skin: Warm and dry  Musculoskeletal: Normal range of motion  Homan's sign negative bilaterally No evidence of DVTs.   Edema: bilaterally non-pitting edema DTR: 2+ Clonus: None   Prenatal labs: ABO, Rh:  O positive Antibody:  negative Rubella:  Immune RPR:   NR HBsAg:   negative HIV:   NR GBS: Negative (11/30 0000) Sickle cell/Hgb electrophoresis:  WNL Pap:  LGSIL 2013 GC positive 09/29/14 Chlamydia: : negative   Genetic screenings:   Glucola:  wnl  Assessment:  IUP at 40.4 weeks NICHD: Category 2 Membranes: intact Bishop Score: 1 GBS negative Diagnosis: post dates IOL options reviewed with patient including cytotec, foley bulb, AROM, and pitocin R&B of IOL reviewed including serial induction, failure, and/or CS requirement Pain management options reviewed Pt and family verbalize understanding, agrees with treatment plan  and wishes to proceed with induction process   Plan:  Admit to L&D for IOL d/t NRFHT Start induction with cytotec Okay to ambulate around unit with wireless monitors  Okay to get up and shower without monitoring  Regular diet prior to starting pitocin Clear/Thin diet after pitocin starts Continue with labor mgmt as ordered IV pain medication per orders PRN Epidural per patient request Foley cath after patient is comfortable with epidural  Anticipate SVD  Attending MD available at all times.   Christine Mccarthy, CNM, MSN 03/31/2015, 6:19 PM

## 2015-04-01 ENCOUNTER — Inpatient Hospital Stay (HOSPITAL_COMMUNITY): Payer: Medicaid Other | Admitting: Anesthesiology

## 2015-04-01 ENCOUNTER — Encounter (HOSPITAL_COMMUNITY): Payer: Self-pay | Admitting: *Deleted

## 2015-04-01 MED ORDER — ONDANSETRON HCL 4 MG PO TABS: 4.0000 mg | ORAL_TABLET | ORAL | Status: DC | PRN

## 2015-04-01 MED ORDER — EPHEDRINE 5 MG/ML INJ
10.0000 mg | INTRAVENOUS | Status: DC | PRN
  Filled 2015-04-01: qty 2

## 2015-04-01 MED ORDER — IBUPROFEN 600 MG PO TABS
600.0000 mg | ORAL_TABLET | Freq: Four times a day (QID) | ORAL | Status: DC
Start: 2015-04-01 — End: 2015-04-03
  Administered 2015-04-01 – 2015-04-03 (×8): 600 mg via ORAL
  Filled 2015-04-01 (×8): qty 1

## 2015-04-01 MED ORDER — MISOPROSTOL 200 MCG PO TABS
1000.0000 ug | ORAL_TABLET | Freq: Once | ORAL | Status: AC
  Administered 2015-04-01: 1000 ug via RECTAL

## 2015-04-01 MED ORDER — LANOLIN HYDROUS EX OINT: TOPICAL_OINTMENT | CUTANEOUS | Status: DC | PRN

## 2015-04-01 MED ORDER — LIDOCAINE HCL (PF) 1 % IJ SOLN
INTRAMUSCULAR | Status: DC | PRN
  Administered 2015-04-01 (×2): 4 mL via EPIDURAL
  Administered 2015-04-01: 2 mL via EPIDURAL

## 2015-04-01 MED ORDER — FENTANYL 2.5 MCG/ML BUPIVACAINE 1/10 % EPIDURAL INFUSION (WH - ANES)
14.0000 mL/h | INTRAMUSCULAR | Status: DC | PRN
  Administered 2015-04-01: 14 mL/h via EPIDURAL
  Filled 2015-04-01: qty 125

## 2015-04-01 MED ORDER — SIMETHICONE 80 MG PO CHEW: 80.0000 mg | CHEWABLE_TABLET | ORAL | Status: DC | PRN

## 2015-04-01 MED ORDER — DIPHENHYDRAMINE HCL 50 MG/ML IJ SOLN: 12.5000 mg | INTRAMUSCULAR | Status: DC | PRN

## 2015-04-01 MED ORDER — TETANUS-DIPHTH-ACELL PERTUSSIS 5-2.5-18.5 LF-MCG/0.5 IM SUSP: 0.5000 mL | Freq: Once | INTRAMUSCULAR | Status: DC

## 2015-04-01 MED ORDER — BENZOCAINE-MENTHOL 20-0.5 % EX AERO
1.0000 "application " | INHALATION_SPRAY | CUTANEOUS | Status: DC | PRN
  Administered 2015-04-01: 1 via TOPICAL
  Filled 2015-04-01: qty 56

## 2015-04-01 MED ORDER — OXYTOCIN 10 UNIT/ML IJ SOLN
1.0000 m[IU]/min | INTRAVENOUS | Status: DC
  Administered 2015-04-01: 1 m[IU]/min via INTRAVENOUS

## 2015-04-01 MED ORDER — WITCH HAZEL-GLYCERIN EX PADS: 1.0000 "application " | MEDICATED_PAD | CUTANEOUS | Status: DC | PRN

## 2015-04-01 MED ORDER — OXYCODONE-ACETAMINOPHEN 5-325 MG PO TABS: 2.0000 | ORAL_TABLET | ORAL | Status: DC | PRN

## 2015-04-01 MED ORDER — PHENYLEPHRINE 40 MCG/ML (10ML) SYRINGE FOR IV PUSH (FOR BLOOD PRESSURE SUPPORT)
80.0000 ug | PREFILLED_SYRINGE | INTRAVENOUS | Status: DC | PRN
  Filled 2015-04-01: qty 2
  Filled 2015-04-01: qty 20

## 2015-04-01 MED ORDER — MISOPROSTOL 200 MCG PO TABS
ORAL_TABLET | ORAL | Status: AC
  Filled 2015-04-01: qty 5

## 2015-04-01 MED ORDER — DIPHENHYDRAMINE HCL 25 MG PO CAPS
25.0000 mg | ORAL_CAPSULE | Freq: Four times a day (QID) | ORAL | Status: DC | PRN
  Filled 2015-04-01: qty 1

## 2015-04-01 MED ORDER — ACETAMINOPHEN 325 MG PO TABS: 650.0000 mg | ORAL_TABLET | ORAL | Status: DC | PRN

## 2015-04-01 MED ORDER — OXYCODONE-ACETAMINOPHEN 5-325 MG PO TABS
1.0000 | ORAL_TABLET | ORAL | Status: DC | PRN
  Administered 2015-04-03: 1 via ORAL
  Filled 2015-04-01: qty 1

## 2015-04-01 MED ORDER — BUPIVACAINE HCL (PF) 0.25 % IJ SOLN
INTRAMUSCULAR | Status: DC | PRN
  Administered 2015-04-01: 5 mL via EPIDURAL

## 2015-04-01 MED ORDER — SENNOSIDES-DOCUSATE SODIUM 8.6-50 MG PO TABS
2.0000 | ORAL_TABLET | ORAL | Status: DC
Start: 2015-04-02 — End: 2015-04-03
  Administered 2015-04-03: 2 via ORAL
  Filled 2015-04-01: qty 2

## 2015-04-01 MED ORDER — DIBUCAINE 1 % RE OINT: 1.0000 "application " | TOPICAL_OINTMENT | RECTAL | Status: DC | PRN

## 2015-04-01 MED ORDER — ONDANSETRON HCL 4 MG/2ML IJ SOLN: 4.0000 mg | INTRAMUSCULAR | Status: DC | PRN

## 2015-04-01 MED ORDER — ZOLPIDEM TARTRATE 5 MG PO TABS: 5.0000 mg | ORAL_TABLET | Freq: Every evening | ORAL | Status: DC | PRN

## 2015-04-01 MED ORDER — PRENATAL MULTIVITAMIN CH
1.0000 | ORAL_TABLET | Freq: Every day | ORAL | Status: DC
  Filled 2015-04-01 (×2): qty 1

## 2015-04-01 NOTE — Anesthesia Procedure Notes (Signed)
Epidural Patient location during procedure: OB  Staffing Anesthesiologist: Ayris Carano Performed by: anesthesiologist   Preanesthetic Checklist Completed: patient identified, site marked, surgical consent, pre-op evaluation, timeout performed, IV checked, risks and benefits discussed and monitors and equipment checked  Epidural Patient position: sitting Prep: site prepped and draped and DuraPrep Patient monitoring: continuous pulse ox and blood pressure Approach: midline Location: L3-L4 Injection technique: LOR saline  Needle:  Needle type: Tuohy  Needle gauge: 17 G Needle length: 9 cm and 9 Needle insertion depth: 6 cm Catheter type: closed end flexible Catheter size: 19 Gauge Catheter at skin depth: 11 cm Test dose: negative  Assessment Events: blood not aspirated, injection not painful, no injection resistance, negative IV test and no paresthesia  Additional Notes Patient identified. Risks/Benefits/Options discussed with patient including but not limited to bleeding, infection, nerve damage, paralysis, failed block, incomplete pain control, headache, blood pressure changes, nausea, vomiting, reactions to medications, itching and postpartum back pain. Confirmed with bedside nurse the patient's most recent platelet count. Confirmed with patient that they are not currently taking any anticoagulation, have any bleeding history or any family history of bleeding disorders. Patient expressed understanding and wished to proceed. All questions were answered. Sterile technique was used throughout the entire procedure. Please see nursing notes for vital signs. Test dose was given through epidural catheter and negative prior to continuing to dose epidural or start infusion. Warning signs of high block given to the patient including shortness of breath, tingling/numbness in hands, complete motor block, or any concerning symptoms with instructions to call for help. Patient was given  instructions on fall risk and not to get out of bed. All questions and concerns addressed with instructions to call with any issues or inadequate analgesia.  Reason for block:procedure for pain

## 2015-04-01 NOTE — Consult Note (Signed)
Neonatology Note:  Attendance at [place] team responded to a Code Apgar call to room # 165 following NSVD, due to infant with apnea. The requesting practitioner was V. Standard, CNM, for Dr. Raphael Gibney. The mother is a G1P0 O pos, GBS neg with rapid progress of labor at term. ROM occurred 1 hour PTD and the fluid was clear. No history of maternally administered medications that would affect baby's respiratory effort, no distress. At delivery, the baby appeared stunned and was apneic. The OB nursing staff in attendance gave vigorous stimulation and a Code Apgar was called. They applied PPV for about 6 breaths, after which the baby started breathing. Our team arrived at 3 minutes of life, at which time the baby was breathing, slightly dusky, but improving, with good tone and reflexes. I checked her O2 saturation, which was 92% in room air at 5-6 minutes. Her lungs were clear, PE wnl except for some head molding. Ap 4/9.  I spoke with the parents in the DR, then transferred the baby to the Pediatrician's care.   Real Cons, MD

## 2015-04-01 NOTE — Anesthesia Preprocedure Evaluation (Signed)
Anesthesia Evaluation  Patient identified by MRN, date of birth, ID band Patient awake    Reviewed: Allergy & Precautions, H&P , Patient's Chart, lab work & pertinent test results  Airway Mallampati: II  TM Distance: >3 FB Neck ROM: full    Dental no notable dental hx.    Pulmonary neg pulmonary ROS,    Pulmonary exam normal breath sounds clear to auscultation       Cardiovascular negative cardio ROS Normal cardiovascular exam Rhythm:regular Rate:Normal     Neuro/Psych negative neurological ROS  negative psych ROS   GI/Hepatic negative GI ROS, Neg liver ROS,   Endo/Other  negative endocrine ROS  Renal/GU negative Renal ROS  negative genitourinary   Musculoskeletal   Abdominal   Peds  Hematology negative hematology ROS (+)   Anesthesia Other Findings Pregnancy - uncomplicated Platelets and allergies reviewed Denies active cardiac or pulmonary symptoms, METS > 4  Denies blood thinning medications, bleeding disorders, hypertension, asthma, supine hypotension syndrome, previous anesthesia difficulties    Reproductive/Obstetrics (+) Pregnancy                             Anesthesia Physical Anesthesia Plan  ASA: II  Anesthesia Plan: Epidural   Post-op Pain Management:    Induction:   Airway Management Planned:   Additional Equipment:   Intra-op Plan:   Post-operative Plan:   Informed Consent: I have reviewed the patients History and Physical, chart, labs and discussed the procedure including the risks, benefits and alternatives for the proposed anesthesia with the patient or authorized representative who has indicated his/her understanding and acceptance.     Plan Discussed with:   Anesthesia Plan Comments:         Anesthesia Quick Evaluation

## 2015-04-01 NOTE — Progress Notes (Signed)
Subjective: Utilizing labor ball bedside to cope with contractions.  Desires to labor unmedicated. Family at bedside for support.      Objective: BP 121/83 mmHg  Pulse 61  Temp(Src) 98.1 F (36.7 C) (Oral)  Resp 18  Ht 5\' 7"  (1.702 m)  Wt 83.462 kg (184 lb)  BMI 28.81 kg/m2  LMP 05/13/2014       Filed Vitals:   04/01/15 0401 04/01/15 0432 04/01/15 0501 04/01/15 0532  BP: 119/71 116/68 121/83 126/77  Pulse: 68 56 61 72  Temp:      TempSrc:      Resp: 18 18 18 18   Height:      Weight:        FHT: Category 1, BL 135 bpm, moderate variability, no accels UC:   regular, every 2 minutes SVE:   Dilation: 5 Effacement (%): 80, 90 Station: -1 Exam by:: R. Diesel Lina, CNM Pitocin at 7 milliunits/min  Pain management:  Non- pharmacologic, utilizing movement  Assessment:  iup 40.5 wks  IOL postdates S/p foley bulb  Membranes intact GBS negative  Plan: Discussed and Recommended AROM  - however pt would prefer to wait to see if she has more cervical change before AROM Continue current management plan Anticipate SVD   [place], [place] 04/01/2015, 5:33 AM

## 2015-04-01 NOTE — Progress Notes (Signed)
Labor Progress  Subjective: Pt unable to manage ctx pain and has request and epidural  Objective: BP 130/78 mmHg  Pulse 60  Temp(Src) 98.1 F (36.7 C) (Oral)  Resp 18  Ht 5\' 7"  (1.702 m)  Wt 184 lb (83.462 kg)  BMI 28.81 kg/m2  LMP 05/13/2014     FHT: 140, moderate variability, + accel, occasional variable decel CTX:  regular, every 3 minutes Uterus gravid, soft non tender SVE:  Dilation: 6.5 Effacement (%): 80 Station: -1 Exam by:: Smith Pitocin at 58mUn/min  Assessment:  IUP at 40.5 weeks NICHD: Category 2 Membranes:  intact Labor progress: IOL Pitocin Augmentation GBS: negative  Plan: Continue labor plan Continuous monitoring Epidural Foley placement 30 minutes after epidural AROM IUPC  Will reassess with cervical exam at 30 minutes after epidural or earlier if necessary Continue pitocin per protocol     Kaithlyn Teagle, CNM, MSN 04/01/2015. 10:45 AM

## 2015-04-01 NOTE — Progress Notes (Signed)
Labor Progress  Subjective: Pt c/o increase pain not relieved by IV pain meds.  Pt desires a natural birth and declines and epidural  Objective: BP 130/78 mmHg  Pulse 60  Temp(Src) 98.1 F (36.7 C) (Oral)  Resp 18  Ht 5\' 7"  (1.702 m)  Wt 184 lb (83.462 kg)  BMI 28.81 kg/m2  LMP 05/13/2014     FHT: 145, moderate variability, + accel, no decel CTX:  regular, every 3-5 minutes Uterus gravid, soft non tender SVE:  Dilation: 5.5 Effacement (%): 80 Station: -1 Exam by:: Doctor, hospital Pitocin at 99mUn/min  Assessment:  IUP at 40.5 weeks NICHD: Category 1 Membranes:  Intact, decline AROM Labor progress: IOL Pitocin Augmentation GBS: negative   Plan: Continue labor plan Continuous/intermittent monitoring Rest Ambulate Frequent position changes to facilitate fetal rotation and descent. Will reassess with cervical exam at 1000 or earlier if necessary Continue pitocin per protocol      Sonya Gunnoe, CNM, MSN 04/01/2015. 10:27 AM

## 2015-04-01 NOTE — Anesthesia Postprocedure Evaluation (Signed)
Anesthesia Post Note  Patient: Christine Mccarthy  Procedure(s) Performed: * No procedures listed *  Patient location during evaluation: Mother Baby Anesthesia Type: Epidural Level of consciousness: awake and alert Pain management: satisfactory to patient Vital Signs Assessment: post-procedure vital signs reviewed and stable Respiratory status: respiratory function stable Cardiovascular status: stable Postop Assessment: no headache, no backache, epidural receding, patient able to bend at knees, no signs of nausea or vomiting and adequate PO intake Anesthetic complications: no    Last Vitals:  Filed Vitals:   04/01/15 1402 04/01/15 1417  BP: 142/57 120/68  Pulse: 76 83  Temp:    Resp:      Last Pain:  Filed Vitals:   04/01/15 1421  PainSc: 9                  Jakalyn Kratky

## 2015-04-01 NOTE — Progress Notes (Signed)
Subjective: Notified by RN that foley bulb is out. Pt states she feels more comfortable now that the foley bulb is out.  Coping well with contractions. Desires to avoid an Epidural if she is able.  Family at bedside for support.     Objective: BP 95/65 mmHg  Pulse 112  Temp(Src) 98.1 F (36.7 C) (Oral)  Resp 20  Ht 5\' 7"  (1.702 m)  Wt 83.462 kg (184 lb)  BMI 28.81 kg/m2  LMP 05/13/2014      FHT: Category 1, BL 130 bpm, moderate variability, +accels, no decels UC:   regular, every 5-6 minutes SVE:   Dilation: 4.5 Effacement (%): 80 Station: -2 Exam by:: R. Nou Chard, CNM Membranes:  intact   Pitocin: None  Assessment:  iup 40.5 wks  IOL postdates S/p foley bulb  Membranes intact GBS negative   Plan: Discussed the need to increase contractions frequency and intensity with patient  Discussed R/B of  pitocin vs AROM  -  Pt prefers to begin pitocin and hold of on AROM  At this time Begin Pitocin, 1x2 milliunits/min  Per protocol  Continue current management plan   Christine Mccarthy CNM, [place] 04/01/2015, 12:37 AM

## 2015-04-02 LAB — CBC
HCT: 24.1 % — ABNORMAL LOW (ref 36.0–46.0)
HEMOGLOBIN: 7.9 g/dL — AB (ref 12.0–15.0)
MCH: 27 pg (ref 26.0–34.0)
MCHC: 32.8 g/dL (ref 30.0–36.0)
MCV: 82.3 fL (ref 78.0–100.0)
Platelets: 237 10*3/uL (ref 150–400)
RBC: 2.93 MIL/uL — AB (ref 3.87–5.11)
RDW: 14.6 % (ref 11.5–15.5)
WBC: 16.7 10*3/uL — AB (ref 4.0–10.5)

## 2015-04-02 MED ORDER — FERROUS SULFATE 325 (65 FE) MG PO TABS
325.0000 mg | ORAL_TABLET | Freq: Two times a day (BID) | ORAL | Status: DC
  Administered 2015-04-02 – 2015-04-03 (×3): 325 mg via ORAL
  Filled 2015-04-02 (×3): qty 1

## 2015-04-02 NOTE — Progress Notes (Signed)
UR chart review completed.  

## 2015-04-02 NOTE — Progress Notes (Signed)
Christine Mccarthy  Post Partum Day 1:S/P SVD with repair of 1st Degree Perineal and 2nd Degree Vaginal Lacerations  Subjective: Patient up ad lib, denies syncope or dizziness. Reports consuming regular diet without issues and denies N/V. Denies issues with urination and reports bleeding is "good."  Patient is breastfeeding and reports going well.  Desires oral pill for postpartum contraception.  Pain is being managed with use of motrin.  Objective: Filed Vitals:   04/01/15 1530 04/01/15 1630 04/01/15 2030 04/02/15 0503  BP: 110/67 122/76 121/71 107/60  Pulse: 76 78 74 65  Temp: 98.2 F (36.8 C) 98.5 F (36.9 C) 99.1 F (37.3 C) 98.2 F (36.8 C)  TempSrc: Oral Oral Oral   Resp: 18 18 18 18   Height:      Weight:      SpO2: 98%  100% 100%    Recent Labs  03/31/15 1750 04/02/15 0525  HGB 10.8* 7.9*  HCT 33.4* 24.1*    Physical Exam:  General: alert, cooperative and no distress Mood/Affect: Appropriate/Appropriate Lungs: clear to auscultation, no wheezes, rales or rhonchi, symmetric air entry.  Heart: normal rate and regular rhythm. Breast: not examined. Abdomen:  + bowel sounds, Soft, NT Uterine Fundus: firm at the umbilicus Lochia: appropriate Laceration: Not examined Skin: Warm, Dry DVT Evaluation: No cords or calf tenderness. No significant calf/ankle edema.  Assessment S/P Vaginal Delivery-Day 1 Normal Involution Breast Feeding Asymptomatic Anemia  Plan: Educated regarding bleeding; what to expect, what is too much, when to call Desires oral pill for bcm Reports pain is "so-so" with motrin, encouraged to take percocet as needed Informed of anemia-s/s of decompensation discussed and initiation of iron supplement Informed of anticipated discharge tomorro Continue current care Dr. Wilma Flavin to be updated on patient status   Maryann Conners, MSN, CNM 04/02/2015, 8:40 AM

## 2015-04-02 NOTE — Lactation Note (Signed)
This note was copied from the chart of Christine Mccarthy. Lactation Consultation Note New mom has been BF well. Mom has small breast w/good everted large nipples, but not to large for baby. Has space between breast, may be d/t small breast. Breast tissue approximately size of orange. Hand expression demonstrated w/easy flow of colostrum. Expressed 71ml of colostrum into spoon, demonstrated spoon feeding. Mom encouraged to feed baby 8-12 times/24 hours and with feeding cues. Referred to Baby and Me Book in Breastfeeding section Pg. 22-23 for position options and Proper latch demonstration. Educated about newborn behavior, I&O, cluster feeding, supply and demand. Mom shown how to use DEBP & how to disassemble, clean, & reassemble parts by RN, mom knows to pump q3h for 15-20 min. [facility] brochure given w/resources, support groups and [place] services. Patient Name: Christine Carley Abele [id number] Date: 04/02/2015 Reason for consult: Initial assessment   Maternal Data Has patient been taught Hand Expression?: Yes Does the patient have breastfeeding experience prior to this delivery?: No  Feeding Feeding Type: Breast Milk Length of feed: 15 min (still BF)  LATCH Score/Interventions Latch: Grasps breast easily, tongue down, lips flanged, rhythmical sucking. Intervention(s): Adjust position;Assist with latch;Breast massage;Breast compression  Audible Swallowing: Spontaneous and intermittent Intervention(s): Skin to skin;Hand expression;Alternate breast massage  Type of Nipple: Everted at rest and after stimulation  Comfort (Breast/Nipple): Soft / non-tender     Hold (Positioning): Assistance needed to correctly position infant at breast and maintain latch. Intervention(s): Skin to skin;Support Pillows;Breastfeeding basics reviewed  LATCH Score: 9  Lactation Tools Discussed/Used Tools: Pump Breast pump type: Double-Electric Breast Pump   Consult Status Consult Status: Follow-up Date:  04/03/15 Follow-up type: In-patient    Theodoro Kalata 04/02/2015, 3:58 AM

## 2015-04-03 LAB — RPR: RPR Ser Ql: NONREACTIVE

## 2015-04-03 MED ORDER — FERROUS SULFATE 325 (65 FE) MG PO TABS: 325.0000 mg | ORAL_TABLET | Freq: Two times a day (BID) | ORAL | Status: AC

## 2015-04-03 MED ORDER — SENNOSIDES-DOCUSATE SODIUM 8.6-50 MG PO TABS: 2.0000 | ORAL_TABLET | Freq: Every evening | ORAL | Status: AC | PRN

## 2015-04-03 MED ORDER — OXYCODONE-ACETAMINOPHEN 5-325 MG PO TABS: 1.0000 | ORAL_TABLET | ORAL | Status: DC | PRN

## 2015-04-03 MED ORDER — IBUPROFEN 600 MG PO TABS: 600.0000 mg | ORAL_TABLET | Freq: Four times a day (QID) | ORAL | Status: DC

## 2015-04-03 NOTE — Discharge Summary (Signed)
OB Discharge Summary  Patient Name: Christine Mccarthy DOB: 09-17-1990 MRN: [id number]  Date of admission: 03/31/2015 Delivering MD: Sallee Provencal   Date of discharge: 04/03/2015  Admitting diagnosis: 40 wks, contractions Intrauterine pregnancy: [redacted]w[redacted]d     Secondary diagnosis:Active Problems:   Active labor at term   Normal vaginal delivery  Additional problems:none     Discharge diagnosis: Term Pregnancy Delivered and Anemia                                                                     Post partum procedures:none  Augmentation: AROM, Pitocin, Cytotec and Foley Balloon  Complications: None  Hospital course:  Induction of Labor With Vaginal Delivery   25 y.o. yo G1P1001 at [redacted]w[redacted]d was admitted to the hospital 03/31/2015 for induction of labor.  Indication for induction: Postdates.  Patient had an uncomplicated labor course as follows: Membrane Rupture Time/Date: 11:42 AM ,04/01/2015   Intrapartum Procedures: Episiotomy: None [1]                                         Lacerations:  2nd degree [3];Vaginal [6]  Patient had delivery of a Viable infant.  Information for the patient's newborn:  Sifa, Troeger [id number]  Delivery Method: Vaginal, Spontaneous Delivery (Filed from Delivery Summary)   04/01/2015  Details of delivery can be found in separate delivery note.  Patient had a routine postpartum course. Patient is discharged home 04/03/2015.   Physical exam  Filed Vitals:   04/01/15 2030 04/02/15 0503 04/02/15 1826 04/03/15 0700  BP: 121/71 107/60 111/65 113/68  Pulse: 74 65 77 66  Temp: 99.1 F (37.3 C) 98.2 F (36.8 C) 98.3 F (36.8 C) 97.8 F (36.6 C)  TempSrc: Oral  Oral   Resp: 18 18 18 16   Height:      Weight:      SpO2: 100% 100%     General: alert and cooperative Lochia: appropriate Uterine Fundus: firm Incision: Healing well with no significant drainage DVT Evaluation: No evidence of DVT seen on physical exam. Labs: Lab Results    Component Value Date   WBC 16.7* 04/02/2015   HGB 7.9* 04/02/2015   HCT 24.1* 04/02/2015   MCV 82.3 04/02/2015   PLT 237 04/02/2015   No flowsheet data found.  Discharge instruction: per After Visit Summary and "Baby and Me Booklet".  Medications:  Current facility-administered medications:  .  acetaminophen (TYLENOL) tablet 650 mg, 650 mg, Oral, Q4H PRN, Kayliana Codd, CNM .  benzocaine-Menthol (DERMOPLAST) 20-0.5 % topical spray 1 application, 1 application, Topical, PRN, Quiera Diffee, CNM, 1 application at [id number] .  witch hazel-glycerin (TUCKS) pad 1 application, 1 application, Topical, PRN **AND** dibucaine (NUPERCAINAL) 1 % rectal ointment 1 application, 1 application, Rectal, PRN, Enrrique Mierzwa, CNM .  diphenhydrAMINE (BENADRYL) capsule 25 mg, 25 mg, Oral, Q6H PRN, Deema Juncaj, CNM .  ferrous sulfate tablet 325 mg, 325 mg, Oral, BID WC, Gavin Pound, CNM, 325 mg at 04/03/15 0919 .  ibuprofen (ADVIL,MOTRIN) tablet 600 mg, 600 mg, Oral, 4 times per day, Commodore Bellew, CNM, 600 mg at 04/03/15 1255 .  lanolin  ointment, , Topical, PRN, Diani Jillson, CNM .  ondansetron (ZOFRAN) tablet 4 mg, 4 mg, Oral, Q4H PRN **OR** ondansetron (ZOFRAN) injection 4 mg, 4 mg, Intravenous, Q4H PRN, Mahmoud Blazejewski, CNM .  oxyCODONE-acetaminophen (PERCOCET/ROXICET) 5-325 MG per tablet 1 tablet, 1 tablet, Oral, Q4H PRN, Riddik Senna, CNM, 1 tablet at 04/03/15 0919 .  oxyCODONE-acetaminophen (PERCOCET/ROXICET) 5-325 MG per tablet 2 tablet, 2 tablet, Oral, Q4H PRN, Davianna Deutschman, CNM .  prenatal multivitamin tablet 1 tablet, 1 tablet, Oral, Q1200, Marissa Weaver, CNM, 1 tablet at 04/02/15 1108 .  senna-docusate (Senokot-S) tablet 2 tablet, 2 tablet, Oral, Q24H, Ajaya Crutchfield, CNM, 2 tablet at 04/03/15 0005 .  simethicone (MYLICON) chewable tablet 80 mg, 80 mg, Oral, PRN, Kiril Hippe, CNM .  Tdap (BOOSTRIX) injection 0.5 mL, 0.5 mL, Intramuscular, Once, Jeannetta Cerutti, CNM, 0.5 mL at  04/02/15 1000 .  zolpidem (AMBIEN) tablet 5 mg, 5 mg, Oral, QHS PRN, Rocco Kerkhoff, CNM  Facility-Administered Medications Ordered in Other Encounters:  .  bupivacaine (PF) (MARCAINE) 0.25 % injection, , , Anesthesia Intra-op, Jillyn Hidden, MD, 5 mL at 04/01/15 1137 .  lidocaine (PF) (XYLOCAINE) 1 % injection, , , Anesthesia Intra-op, Jillyn Hidden, MD, 2 mL at 04/01/15 1104 After Visit Meds:    Medication List    TAKE these medications        [facility] PRENATAL GUMMY 0.4-113.5 MG Chew  Chew 1 tablet by mouth daily.     ferrous sulfate 325 (65 FE) MG tablet  Take 1 tablet (325 mg total) by mouth 2 (two) times daily with a meal.     ibuprofen 600 MG tablet  Commonly known as:  ADVIL,MOTRIN  Take 1 tablet (600 mg total) by mouth every 6 (six) hours.     oxyCODONE-acetaminophen 5-325 MG tablet  Commonly known as:  PERCOCET/ROXICET  Take 1 tablet by mouth every 4 (four) hours as needed (pain scale 4-7).     senna-docusate 8.6-50 MG tablet  Commonly known as:  Senokot-S  Take 2 tablets by mouth at bedtime as needed for mild constipation.      ASK your doctor about these medications        metroNIDAZOLE 0.75 % vaginal gel  Commonly known as:  METROGEL VAGINAL  1 applicator per vagina at HS x 5        Diet: routine diet  Activity: Advance as tolerated. Pelvic rest for 6 weeks.   Outpatient follow up:6 weeks Follow up Appt:No future appointments. Follow up visit: No Follow-up on file.  Postpartum contraception: Progesterone only pills  Newborn Data: Live born female  Birth Weight: 7 lb 13.6 oz (3560 g) APGAR: 4, 9  Baby Feeding: Breast Disposition:home with mother   04/03/2015 Samiha Denapoli, CNM      Postpartum Care After Vaginal Delivery  After you deliver your newborn (postpartum period), the usual stay in the hospital is 24 72 hours. If there were problems with your labor or delivery, or if you have other medical problems, you might be in the hospital  longer.  While you are in the hospital, you will receive help and instructions on how to care for yourself and your newborn during the postpartum period.  While you are in the hospital:  Be sure to tell your nurses if you have pain or discomfort, as well as where you feel the pain and what makes the pain worse.  If you had an incision made near your vagina (episiotomy) or if you had some tearing during delivery, the  nurses may put ice packs on your episiotomy or tear. The ice packs may help to reduce the pain and swelling.  If you are breastfeeding, you may feel uncomfortable contractions of your uterus for a couple of weeks. This is normal. The contractions help your uterus get back to normal size.  It is normal to have some bleeding after delivery.  For the first 1 3 days after delivery, the flow is red and the amount may be similar to a period.  It is common for the flow to start and stop.  In the first few days, you may pass some small clots. Let your nurses know if you begin to pass large clots or your flow increases.  Do not  flush blood clots down the toilet before having the nurse look at them.  During the next 3 10 days after delivery, your flow should become more watery and pink or brown-tinged in color.  Ten to fourteen days after delivery, your flow should be a small amount of yellowish-white discharge.  The amount of your flow will decrease over the first few weeks after delivery. Your flow may stop in 6 8 weeks. Most women have had their flow stop by 12 weeks after delivery.  You should change your sanitary pads frequently.  Wash your hands thoroughly with soap and water for at least 20 seconds after changing pads, using the toilet, or before holding or feeding your newborn.  You should feel like you need to empty your bladder within the first 6 8 hours after delivery.  In case you become weak, lightheaded, or faint, call your nurse before you get out of bed for the  first time and before you take a shower for the first time.  Within the first few days after delivery, your breasts may begin to feel tender and full. This is called engorgement. Breast tenderness usually goes away within 48 72 hours after engorgement occurs. You may also notice milk leaking from your breasts. If you are not breastfeeding, do not stimulate your breasts. Breast stimulation can make your breasts produce more milk.  Spending as much time as possible with your newborn is very important. During this time, you and your newborn can feel close and get to know each other. Having your newborn stay in your room (rooming in) will help to strengthen the bond with your newborn. It will give you time to get to know your newborn and become comfortable caring for your newborn.  Your hormones change after delivery. Sometimes the hormone changes can temporarily cause you to feel sad or tearful. These feelings should not last more than a few days. If these feelings last longer than that, you should talk to your caregiver.  If desired, talk to your caregiver about methods of family planning or contraception.  Talk to your caregiver about immunizations. Your caregiver may want you to have the following immunizations before leaving the hospital:  Tetanus, diphtheria, and pertussis (Tdap) or tetanus and diphtheria (Td) immunization. It is very important that you and your family (including grandparents) or others caring for your newborn are up-to-date with the Tdap or Td immunizations. The Tdap or Td immunization can help protect your newborn from getting ill.  Rubella immunization.  Varicella (chickenpox) immunization.  Influenza immunization. You should receive this annual immunization if you did not receive the immunization during your pregnancy. Document Released: 01/05/2007 Document Revised: 12/03/2011 Document Reviewed: 11/05/2011 [facility] Patient Information 2014 [place].   Postpartum  Depression and Baby  Blues  The postpartum period begins right after the birth of a baby. During this time, there is often a great amount of joy and excitement. It is also a time of considerable changes in the life of the parent(s). Regardless of how many times a mother gives birth, each child brings new challenges and dynamics to the family. It is not unusual to have feelings of excitement accompanied by confusing shifts in moods, emotions, and thoughts. All mothers are at risk of developing postpartum depression or the "baby blues." These mood changes can occur right after giving birth, or they may occur many months after giving birth. The baby blues or postpartum depression can be mild or severe. Additionally, postpartum depression can resolve rather quickly, or it can be a long-term condition. CAUSES Elevated hormones and their rapid decline are thought to be a main cause of postpartum depression and the baby blues. There are a number of hormones that radically change during and after pregnancy. Estrogen and progesterone usually decrease immediately after delivering your baby. The level of thyroid hormone and various cortisol steroids also rapidly drop. Other factors that play a major role in these changes include major life events and genetics.  RISK FACTORS If you have any of the following risks for the baby blues or postpartum depression, know what symptoms to watch out for during the postpartum period. Risk factors that may increase the likelihood of getting the baby blues or postpartum depression include: 1. Havinga personal or family history of depression. 2. Having depression while being pregnant. 3. Having premenstrual or oral contraceptive-associated mood issues. 4. Having exceptional life stress. 5. Having marital conflict. 6. Lacking a social support network. 7. Having a baby with special needs. 8. Having health problems such as diabetes. SYMPTOMS Baby blues symptoms include:  Brief  fluctuations in mood, such as going from extreme happiness to sadness.  Decreased concentration.  Difficulty sleeping.  Crying spells, tearfulness.  Irritability.  Anxiety. Postpartum depression symptoms typically begin within the first month after giving birth. These symptoms include:  Difficulty sleeping or excessive sleepiness.  Marked weight loss.  Agitation.  Feelings of worthlessness.  Lack of interest in activity or food. Postpartum psychosis is a very concerning condition and can be dangerous. Fortunately, it is rare. Displaying any of the following symptoms is cause for immediate medical attention. Postpartum psychosis symptoms include:  Hallucinations and delusions.  Bizarre or disorganized behavior.  Confusion or disorientation. DIAGNOSIS  A diagnosis is made by an evaluation of your symptoms. There are no medical or lab tests that lead to a diagnosis, but there are various questionnaires that a caregiver may use to identify those with the baby blues, postpartum depression, or psychosis. Often times, a screening tool called the [place] Postnatal Depression Scale is used to diagnose depression in the postpartum period.  TREATMENT The baby blues usually goes away on its own in 1 to 2 weeks. Social support is often all that is needed. You should be encouraged to get adequate sleep and rest. Occasionally, you may be given medicines to help you sleep.  Postpartum depression requires treatment as it can last several months or longer if it is not treated. Treatment may include individual or group therapy, medicine, or both to address any social, physiological, and psychological factors that may play a role in the depression. Regular exercise, a healthy diet, rest, and social support may also be strongly recommended.  Postpartum psychosis is more serious and needs treatment right away. Hospitalization is often needed. HOME  CARE INSTRUCTIONS  Get as much rest as you can. Nap  when the baby sleeps.  Exercise regularly. Some women find yoga and walking to be beneficial.  Eat a balanced and nourishing diet.  Do little things that you enjoy. Have a cup of tea, take a bubble bath, read your favorite magazine, or listen to your favorite music.  Avoid alcohol.  Ask for help with household chores, cooking, grocery shopping, or running errands as needed. Do not try to do everything.  Talk to people close to you about how you are feeling. Get support from your partner, family members, friends, or other new moms.  Try to stay positive in how you think. Think about the things you are grateful for.  Do not spend a lot of time alone.  Only take medicine as directed by your caregiver.  Keep all your postpartum appointments.  Let your caregiver know if you have any concerns. SEEK MEDICAL CARE IF: You are having a reaction or problems with your medicine. SEEK IMMEDIATE MEDICAL CARE IF:  You have suicidal feelings.  You feel you may harm the baby or someone else. Document Released: 12/13/2003 Document Revised: 06/02/2011 Document Reviewed: 01/14/2011 [facility] Patient Information 2014 [place], [place].     Breastfeeding Deciding to breastfeed is one of the best choices you can make for you and your baby. A change in hormones during pregnancy causes your breast tissue to grow and increases the number and size of your milk ducts. These hormones also allow proteins, sugars, and fats from your blood supply to make breast milk in your milk-producing glands. Hormones prevent breast milk from being released before your baby is born as well as prompt milk flow after birth. Once breastfeeding has begun, thoughts of your baby, as well as his or her sucking or crying, can stimulate the release of milk from your milk-producing glands.  BENEFITS OF BREASTFEEDING For Your Baby  Your first milk (colostrum) helps your baby's digestive system function better.   There are  antibodies in your milk that help your baby fight off infections.   Your baby has a lower incidence of asthma, allergies, and sudden infant death syndrome.   The nutrients in breast milk are better for your baby than infant formulas and are designed uniquely for your baby's needs.   Breast milk improves your baby's brain development.   Your baby is less likely to develop other conditions, such as childhood obesity, asthma, or type 2 diabetes mellitus.  For You   Breastfeeding helps to create a very special bond between you and your baby.   Breastfeeding is convenient. Breast milk is always available at the correct temperature and costs nothing.   Breastfeeding helps to burn calories and helps you lose the weight gained during pregnancy.   Breastfeeding makes your uterus contract to its prepregnancy size faster and slows bleeding (lochia) after you give birth.   Breastfeeding helps to lower your risk of developing type 2 diabetes mellitus, osteoporosis, and breast or ovarian cancer later in life. SIGNS THAT YOUR BABY IS HUNGRY Early Signs of Hunger  Increased alertness or activity.  Stretching.  Movement of the head from side to side.  Movement of the head and opening of the mouth when the corner of the mouth or cheek is stroked (rooting).  Increased sucking sounds, smacking lips, cooing, sighing, or squeaking.  Hand-to-mouth movements.  Increased sucking of fingers or hands. Late Signs of Hunger  Fussing.  Intermittent crying. Extreme Signs of Hunger Signs  of extreme hunger will require calming and consoling before your baby will be able to breastfeed successfully. Do not wait for the following signs of extreme hunger to occur before you initiate breastfeeding:   Restlessness.  A loud, strong cry.   Screaming.   BREASTFEEDING BASICS Breastfeeding Initiation  Find a comfortable place to sit or lie down, with your neck and back well supported.  Place a  pillow or rolled up blanket under your baby to bring him or her to the level of your breast (if you are seated). Nursing pillows are specially designed to help support your arms and your baby while you breastfeed.  Make sure that your baby's abdomen is facing your abdomen.   Gently massage your breast. With your fingertips, massage from your chest wall toward your nipple in a circular motion. This encourages milk flow. You may need to continue this action during the feeding if your milk flows slowly.  Support your breast with 4 fingers underneath and your thumb above your nipple. Make sure your fingers are well away from your nipple and your baby's mouth.   Stroke your baby's lips gently with your finger or nipple.   When your baby's mouth is open wide enough, quickly bring your baby to your breast, placing your entire nipple and as much of the colored area around your nipple (areola) as possible into your baby's mouth.   More areola should be visible above your baby's upper lip than below the lower lip.   Your baby's tongue should be between his or her lower gum and your breast.   Ensure that your baby's mouth is correctly positioned around your nipple (latched). Your baby's lips should create a seal on your breast and be turned out (everted).  It is common for your baby to suck about 2-3 minutes in order to start the flow of breast milk. Latching Teaching your baby how to latch on to your breast properly is very important. An improper latch can cause nipple pain and decreased milk supply for you and poor weight gain in your baby. Also, if your baby is not latched onto your nipple properly, he or she may swallow some air during feeding. This can make your baby fussy. Burping your baby when you switch breasts during the feeding can help to get rid of the air. However, teaching your baby to latch on properly is still the best way to prevent fussiness from swallowing air while  breastfeeding. Signs that your baby has successfully latched on to your nipple:    Silent tugging or silent sucking, without causing you pain.   Swallowing heard between every 3-4 sucks.    Muscle movement above and in front of his or her ears while sucking.  Signs that your baby has not successfully latched on to nipple:   Sucking sounds or smacking sounds from your baby while breastfeeding.  Nipple pain. If you think your baby has not latched on correctly, slip your finger into the corner of your baby's mouth to break the suction and place it between your baby's gums. Attempt breastfeeding initiation again. Signs of Successful Breastfeeding Signs from your baby:   A gradual decrease in the number of sucks or complete cessation of sucking.   Falling asleep.   Relaxation of his or her body.   Retention of a small amount of milk in his or her mouth.   Letting go of your breast by himself or herself. Signs from you:  Breasts that have increased  in firmness, weight, and size 1-3 hours after feeding.   Breasts that are softer immediately after breastfeeding.  Increased milk volume, as well as a change in milk consistency and color by the fifth day of breastfeeding.   Nipples that are not sore, cracked, or bleeding. Signs That Your Randel Books is Getting Enough Milk  Wetting at least 3 diapers in a 24-hour period. The urine should be clear and pale yellow by age 12 days.  At least 3 stools in a 24-hour period by age 12 days. The stool should be soft and yellow.  At least 3 stools in a 24-hour period by age 59 days. The stool should be seedy and yellow.  No loss of weight greater than 10% of birth weight during the first 16 days of age.  Average weight gain of 4-7 ounces (113-198 g) per week after age 31 days.  Consistent daily weight gain by age 85 days, without weight loss after the age of 2 weeks. After a feeding, your baby may spit up a small amount. This is  common. BREASTFEEDING FREQUENCY AND DURATION Frequent feeding will help you make more milk and can prevent sore nipples and breast engorgement. Breastfeed when you feel the need to reduce the fullness of your breasts or when your baby shows signs of hunger. This is called "breastfeeding on demand." Avoid introducing a pacifier to your baby while you are working to establish breastfeeding (the first 4-6 weeks after your baby is born). After this time you may choose to use a pacifier. Research has shown that pacifier use during the first year of a baby's life decreases the risk of sudden infant death syndrome (SIDS). Allow your baby to feed on each breast as long as he or she wants. Breastfeed until your baby is finished feeding. When your baby unlatches or falls asleep while feeding from the first breast, offer the second breast. Because newborns are often sleepy in the first few weeks of life, you may need to awaken your baby to get him or her to feed. Breastfeeding times will vary from baby to baby. However, the following rules can serve as a guide to help you ensure that your baby is properly fed:  Newborns (babies 31 weeks of age or younger) may breastfeed every 1-3 hours.  Newborns should not go longer than 3 hours during the day or 5 hours during the night without breastfeeding.  You should breastfeed your baby a minimum of 8 times in a 24-hour period until you begin to introduce solid foods to your baby at around 50 months of age. BREAST MILK PUMPING Pumping and storing breast milk allows you to ensure that your baby is exclusively fed your breast milk, even at times when you are unable to breastfeed. This is especially important if you are going back to work while you are still breastfeeding or when you are not able to be present during feedings. Your lactation consultant can give you guidelines on how long it is safe to store breast milk.  A breast pump is a machine that allows you to pump milk  from your breast into a sterile bottle. The pumped breast milk can then be stored in a refrigerator or freezer. Some breast pumps are operated by hand, while others use electricity. Ask your lactation consultant which type will work best for you. Breast pumps can be purchased, but some hospitals and breastfeeding support groups lease breast pumps on a monthly basis. A lactation consultant can teach you how  to hand express breast milk, if you prefer not to use a pump.  CARING FOR YOUR BREASTS WHILE YOU BREASTFEED Nipples can become dry, cracked, and sore while breastfeeding. The following recommendations can help keep your breasts moisturized and healthy:  Avoid using soap on your nipples.   Wear a supportive bra. Although not required, special nursing bras and tank tops are designed to allow access to your breasts for breastfeeding without taking off your entire bra or top. Avoid wearing underwire-style bras or extremely tight bras.  Air dry your nipples for 3-17minutes after each feeding.   Use only cotton bra pads to absorb leaked breast milk. Leaking of breast milk between feedings is normal.   Use lanolin on your nipples after breastfeeding. Lanolin helps to maintain your skin's normal moisture barrier. If you use pure lanolin, you do not need to wash it off before feeding your baby again. Pure lanolin is not toxic to your baby. You may also hand express a few drops of breast milk and gently massage that milk into your nipples and allow the milk to air dry. In the first few weeks after giving birth, some women experience extremely full breasts (engorgement). Engorgement can make your breasts feel heavy, warm, and tender to the touch. Engorgement peaks within 3-5 days after you give birth. The following recommendations can help ease engorgement:  Completely empty your breasts while breastfeeding or pumping. You may want to start by applying warm, moist heat (in the shower or with warm  water-soaked hand towels) just before feeding or pumping. This increases circulation and helps the milk flow. If your baby does not completely empty your breasts while breastfeeding, pump any extra milk after he or she is finished.  Wear a snug bra (nursing or regular) or tank top for 1-2 days to signal your body to slightly decrease milk production.  Apply ice packs to your breasts, unless this is too uncomfortable for you.  Make sure that your baby is latched on and positioned properly while breastfeeding. If engorgement persists after 48 hours of following these recommendations, contact your health care provider or a Science writer. OVERALL HEALTH CARE RECOMMENDATIONS WHILE BREASTFEEDING  Eat healthy foods. Alternate between meals and snacks, eating 3 of each per day. Because what you eat affects your breast milk, some of the foods may make your baby more irritable than usual. Avoid eating these foods if you are sure that they are negatively affecting your baby.  Drink milk, fruit juice, and water to satisfy your thirst (about 10 glasses a day).   Rest often, relax, and continue to take your prenatal vitamins to prevent fatigue, stress, and anemia.  Continue breast self-awareness checks.  Avoid chewing and smoking tobacco.  Avoid alcohol and drug use. Some medicines that may be harmful to your baby can pass through breast milk. It is important to ask your health care provider before taking any medicine, including all over-the-counter and prescription medicine as well as vitamin and herbal supplements. It is possible to become pregnant while breastfeeding. If birth control is desired, ask your health care provider about options that will be safe for your baby. SEEK MEDICAL CARE IF:   You feel like you want to stop breastfeeding or have become frustrated with breastfeeding.  You have painful breasts or nipples.  Your nipples are cracked or bleeding.  Your breasts are red,  tender, or warm.  You have a swollen area on either breast.  You have a fever or chills.  You have nausea or vomiting.  You have drainage other than breast milk from your nipples.  Your breasts do not become full before feedings by the fifth day after you give birth.  You feel sad and depressed.  Your baby is too sleepy to eat well.  Your baby is having trouble sleeping.   Your baby is wetting less than 3 diapers in a 24-hour period.  Your baby has less than 3 stools in a 24-hour period.  Your baby's skin or the white part of his or her eyes becomes yellow.   Your baby is not gaining weight by 19 days of age. SEEK IMMEDIATE MEDICAL CARE IF:   Your baby is overly tired (lethargic) and does not want to wake up and feed.  Your baby develops an unexplained fever. Document Released: 03/10/2005 Document Revised: 03/15/2013 Document Reviewed: 09/01/2012 [facility] Patient Information 2015 [place], [place]. This information is not intended to replace advice given to you by your health care provider. Make sure you discuss any questions you have with your health care provider.

## 2015-04-03 NOTE — Lactation Note (Signed)
This note was copied from the chart of Christine Phantasia Dajani. Lactation Consultation Note  Patient Name: Christine Mccarthy [id number] Date: 04/03/2015 Reason for consult: Follow-up assessment  Visited on day of discharge, baby 58 hrs old.  Baby latched and finishing feeding on right breast.  Assisted with latching on left side in football hold.  Teaching on hand placement done to facilitate a deeper areolar grasp. Encouraged skin to skin, and feeding often on cue.  Basic breast feeding teaching reviewed and reminded Mom of OP lactation services available.  Engorgement prevention and treatment discussed.  To call prn for assistance.  Consult Status Consult Status: Complete Date: 04/03/15 Follow-up type: Call as needed    Christine Mccarthy 04/03/2015, 9:39 AM

## 2015-04-06 ENCOUNTER — Inpatient Hospital Stay (HOSPITAL_COMMUNITY)
Admission: AD | Admit: 2015-04-06 | Discharge: 2015-04-06 | Disposition: A | Discharge status: Home / Self Care | Payer: BLUE CROSS/BLUE SHIELD | Source: Ambulatory Visit | Attending: Obstetrics and Gynecology | Admitting: Obstetrics and Gynecology

## 2015-04-06 ENCOUNTER — Inpatient Hospital Stay (HOSPITAL_COMMUNITY): Payer: BLUE CROSS/BLUE SHIELD

## 2015-04-06 ENCOUNTER — Encounter (HOSPITAL_COMMUNITY): Payer: Self-pay | Admitting: *Deleted

## 2015-04-06 DIAGNOSIS — R1030 Lower abdominal pain, unspecified: Secondary | ICD-10-CM | POA: Diagnosis not present

## 2015-04-06 DIAGNOSIS — O9089 Other complications of the puerperium, not elsewhere classified: Secondary | ICD-10-CM | POA: Insufficient documentation

## 2015-04-06 DIAGNOSIS — R52 Pain, unspecified: Secondary | ICD-10-CM

## 2015-04-06 MED ORDER — BUTORPHANOL TARTRATE 1 MG/ML IJ SOLN
1.0000 mg | Freq: Once | INTRAMUSCULAR | Status: AC
  Administered 2015-04-06: 1 mg via INTRAVENOUS
  Filled 2015-04-06: qty 1

## 2015-04-06 NOTE — MAU Note (Signed)
PT   SAYS  SHE  DEL  VAG  ON  SUN -  AND  GROIN AREA  AND  PELVIS  STARTED  HURTING  [facility]   TO TAKE  HOME.     PAIN  HAS  BECOME  WORSE IN PELVIS-  PT  SAYS SHE  CAN  BARELY  WALK.       AT MAU-  BROUGHT  TO RM 7 WITH W/C-  PT   CRYING - BC  OF  PAIN.    TOOK PAIN MEDS  AT  6PM.

## 2015-04-06 NOTE — MAU Provider Note (Signed)
Christine Mccarthy is a 25 y.o. G1P1 PP 5 days, c/o severe groin pain that started the day after delivery and progressively got worse.  She said it hard to walk, stand or move.   History     Patient Active Problem List   Diagnosis Date Noted  . Normal vaginal delivery 04/01/2015  . Active labor at term 03/31/2015  . Bilateral ovarian cysts 08/31/2014  . Bacterial vaginosis 07/06/2013    No chief complaint on file.  HPI  OB History    Gravida Para Term Preterm AB TAB SAB Ectopic Multiple Living   1 1 1   0   0 0 1      Past Medical History  Diagnosis Date  . Medical history non-contributory     Past Surgical History  Procedure Laterality Date  . No past surgeries      Family History  Problem Relation Age of Onset  . Breast cancer Maternal Grandmother     Social History  Substance Use Topics  . Smoking status: Never Smoker   . Smokeless tobacco: Never Used  . Alcohol Use: No    Allergies: No Known Allergies  Prescriptions prior to admission  Medication Sig Dispense Refill Last Dose  . ibuprofen (ADVIL,MOTRIN) 600 MG tablet Take 1 tablet (600 mg total) by mouth every 6 (six) hours. 30 tablet 0 04/05/2015 at 1800  . oxyCODONE-acetaminophen (PERCOCET/ROXICET) 5-325 MG tablet Take 1 tablet by mouth every 4 (four) hours as needed (pain scale 4-7). 30 tablet 0 04/05/2015 at 1800  . Prenatal Vit-Min-FA-Fish Oil ([employer] PRENATAL GUMMY) 0.4-113.5 MG CHEW Chew 1 tablet by mouth daily.   04/05/2015 at Unknown time  . ferrous sulfate 325 (65 FE) MG tablet Take 1 tablet (325 mg total) by mouth 2 (two) times daily with a meal. 60 tablet 3   . senna-docusate (SENOKOT-S) 8.6-50 MG tablet Take 2 tablets by mouth at bedtime as needed for mild constipation. 30 tablet 1     ROS See HPI above, all other systems are negative  Physical Exam   Temperature 99 F (37.2 C), resp. rate 18, height 5\' 7"  (1.702 m), unknown if currently breastfeeding.  Physical Exam Ext:  WNL ABD: Soft, non  tender to palpation, no rebound or guarding Lower abd/groin pain with palpation SVE: deferred   ED Course  Assessment: Groin pain  Plan: Pelvic xray  IV statol  Jakaylee Sasaki, CNM, MSN 04/06/2015. 1:34 AM   MAU Addendum Note  Xray results: Mild pubic symphyseal widing measuring 8cm.    Plan: -Rx for percocet -Discussed need to follow up in office in 2 weeks -Encouraged to call if any questions or concerns arise prior to next scheduled office visit.  -Discharged to home in stable condition -PT referral to be made   Johnsie Moscoso, CNM, MSN 04/06/2015. 3:07 AM

## 2015-04-06 NOTE — MAU Note (Signed)
Pt was given abdominal binder and helped to the car in a wheelchair. DC instructions were given, pt verbalized understanding.

## 2015-05-23 ENCOUNTER — Ambulatory Visit (HOSPITAL_COMMUNITY)
Admission: RE | Admit: 2015-05-23 | Discharge: 2015-05-23 | Disposition: A | Discharge status: Home / Self Care | Payer: BLUE CROSS/BLUE SHIELD | Source: Ambulatory Visit | Attending: Obstetrics and Gynecology | Admitting: Obstetrics and Gynecology

## 2015-05-23 NOTE — Lactation Note (Signed)
Lactation Consult  Mother's reason for visit:  Check latch Visit Type:  Outpatient Appointment Notes:   Consult:  Initial Lactation Consultant:  Tilda Burrow E  ________________________________________________________________________    ________________________________________________________________________  Mother's Name: Matilde Bash Type of delivery:   Breastfeeding Experience:  Every 2 hrs during day Maternal Medical Conditions:  none Maternal Medications: PNV  Baby's Name: Marilynne Halsted Date of Birth: 04/01/2015 Pediatrician: Dr. Orpha Bur Gender: female Gestational Age: [redacted]w[redacted]d (At Birth) Birth Weight: 7 lb 13.6 oz (3560 g) Weight at Discharge: Weight: 7 lb 4.8 oz (3310 g)Date of Discharge: 04/03/2015 Filed Weights   04/01/15 1244 04/01/15 2345 04/03/15 0130  Weight: 7 lb 13.6 oz (3560 g) 7 lb 10.9 oz (3485 g) 7 lb 4.8 oz (3310 g)   Last weight taken from location outside of [facility]: 9 lbs 5 oz 05/09/15 at Pediatrician's office Location: Lactation Office Weight today: 10-5.9    _ Nansi comes in today with her baby of 73 weeks old, for reassurance that baby is latching well, and she is making enough milk.  Mom is very anxious about returning to work, and having enough milk stored in the freezer.  She was told she should be pumping every 2 hours (from friend or online).  Talked to her about importance of focusing on feeding her baby, and adding a pumping or 2 at most per day to add to freezer.   Yee concerned about baby's latch and wanted me to watch baby.  Latched baby in cradle hold.  Nipples erect and fairly long, but baby able to latch deeply onto areola.  Recommended supporting her breast while latching, and holding baby in closer.  Baby does at times back off breast, and pull breast (no pain per Mom).  Clicking occasionally, which stops when baby held in closer.  Lots of basic teaching and reassurance given to  Mom.   Baby has gained 48 oz in 49 days since lowest weight at discharge.  Encouraged Tiombe to try to relax about her milk supply, and pumping storage.  She is very worried about Day Care not having enough milk for baby.  To freeze several small 3-4 oz bottles.   Malaree felt much better following feeding baby and 79 ml transferred from both breasts.  Encouraged her to come to Support Groups, information given.  To call for any other questions.   _______________________________________________________________________  Breastfeeding History (Post Discharge)  Frequency of breastfeeding:  Every 2 hrs Duration of feeding:  10 mins per breast  Pumping Type of pump:  Medela pump in style Frequency:  3-4 times a day Volume:  2-4 oz Infant Intake and Output Assessment Voids:  7 in 24 hrs.  Color:  Clear yellow Stools:  4 in 24 hrs.  Color:  Yellow _______________________________________________________________________ Maternal Breast Assessment Breast:  Soft and Full Nipple:  Erect Pain level:  0 ______________________________________________________________________ Feeding Assessment/Evaluation Initial feeding assessment: Infant's oral assessment:  WNL Positioning:  Cradle Left breast LATCH documentation:  Latch:  2 = Grasps breast easily, tongue down, lips flanged, rhythmical sucking.  Audible swallowing:  2 = Spontaneous and intermittent  Type of nipple:  2 = Everted at rest and after stimulation  Comfort (Breast/Nipple):  2 = Soft / non-tender  Hold (Positioning):  2 = No assistance needed to correctly position infant at breast  LATCH score:  10  Attached assessment:  Deep  Lips flanged:  Yes.    Lips untucked:  Yes.   Suck assessment:  Displays both  Pre-feed weight: 4704 g  Post-feed weight:  4738 g  Amount transferred:  34 ml Additional Feeding Assessment -  Infant's oral assessment:  WNL Positioning:  Cross cradle Right breast LATCH documentation:  Latch:  2 =  Grasps breast easily, tongue down, lips flanged, rhythmical sucking.  Audible swallowing:  2 = Spontaneous and intermittent  Type of nipple:  2 = Everted at rest and after stimulation  Comfort (Breast/Nipple):  2 = Soft / non-tender  Hold (Positioning):  1 = Assistance needed to correctly position infant at breast and maintain latch  LATCH score:  9 Attached assessment:  Deep  Lips flanged:  No.  Lips untucked:  Yes.   Suck assessment:  Displays both Pre-feed weight: 4738 g   Post-feed weight:  4790 g Amount transferred: 42 ml  Total amount transferred:  86 ml

## 2015-06-12 ENCOUNTER — Other Ambulatory Visit: Payer: Self-pay | Admitting: Obstetrics & Gynecology

## 2015-06-15 ENCOUNTER — Other Ambulatory Visit (HOSPITAL_COMMUNITY): Payer: Self-pay | Admitting: Obstetrics and Gynecology

## 2015-06-15 ENCOUNTER — Encounter (HOSPITAL_COMMUNITY)
Admission: RE | Admit: 2015-06-15 | Discharge: 2015-06-15 | Disposition: A | Discharge status: Home / Self Care | Payer: BLUE CROSS/BLUE SHIELD | Source: Ambulatory Visit | Attending: Obstetrics & Gynecology | Admitting: Obstetrics & Gynecology

## 2015-06-15 ENCOUNTER — Encounter (HOSPITAL_COMMUNITY): Payer: Self-pay

## 2015-06-15 DIAGNOSIS — Z01812 Encounter for preprocedural laboratory examination: Secondary | ICD-10-CM | POA: Diagnosis not present

## 2015-06-15 LAB — CBC
HCT: 31.7 % — ABNORMAL LOW (ref 36.0–46.0)
HEMOGLOBIN: 9.8 g/dL — AB (ref 12.0–15.0)
MCH: 24.7 pg — AB (ref 26.0–34.0)
MCHC: 30.9 g/dL (ref 30.0–36.0)
MCV: 80.1 fL (ref 78.0–100.0)
Platelets: 303 10*3/uL (ref 150–400)
RBC: 3.96 MIL/uL (ref 3.87–5.11)
RDW: 16.3 % — ABNORMAL HIGH (ref 11.5–15.5)
WBC: 8.2 10*3/uL (ref 4.0–10.5)

## 2015-06-15 NOTE — Patient Instructions (Addendum)
   Your procedure is scheduled on: MARCH 28 (TUESDAY)  Enter through the Main Entrance of [facility] up the phone at the desk and dial [phone] and inform us of your arrival.  Please call this number if you have any problems the morning of surgery: [phone]  Remember: Do not eat food after midnight: MARCH 27 (MONDAY) Do not drink clear liquids after: 9AM DAY OF SURGERY   Do not wear jewelry, make-up, or FINGER nail polish No metal in your hair or on your body. Do not wear lotions, powders, perfumes.  You may wear deodorant.  Do not bring valuables to the hospital. Contacts, dentures or bridgework may not be worn into surgery.  Leave suitcase in the car. After Surgery it may be brought to your room. For patients being admitted to the hospital, checkout time is 11:00am the day of discharge.

## 2015-06-15 NOTE — H&P (Signed)
Christine Mccarthy is a 25 y.o. female  P: 1-0-0-1  who presents for removal of bilateral dermoid cysts.  The patient was found to have bilateral dermoid cysts during her recent pregnancy but was essentially asymptomatic.  After her vaginal  delivery  03/30/2015, however,  she noticed almost daily  a dull/achy pelvic pain that was more noticeable with walking and being positioned on either side with reclining. . A pelvic ultrasound,  June 07, 2015 revealed:  uterus-5.43 x 5.13 x 3.74 cm, endometrium-4.72 mm;  Left Ovary-10.44 x 5.88 x 6.16 cm with #2 complex masses (with features of dermoids): 8.3 x 6.2 x 5.2 cm  and  3.8 x 2.2 x 2.7 cm,  neither had color Doppler flow;  Right Ovary-7.0 x 6.13 x 4.7 cm with a complex mass (dermoid appearing) 4.6 x 4.7 x 4.4 cm with no color Doppler flow.    A CBC, at that same time was normal except H/H = 10.2/33.8 as was an AFP Tumor Marker and Inhibin A & B.  Prior to pregnancy the patient had regular menstrual periods for 7 days with pad changed 4 times a day and  no cramps.  Since delivery she's had not changes in bowel or bladder function but admits to post coital pelvic discomfort (achiness) that will last for 2 days.  Given the size and nature of her bilateral ovarian cysts,  the patient has decided to proceed with bilateral ovarian cystectomy for management.   Past Medical History  OB History: G:1;  P: 1-0-0-1;  SVB  March 30, 2015  GYN History: menarche: 25 YO    LMP: Breastfeeding (no menses)    Contracepton: Jollivette Progestin Only Pills  The patient denies history of sexually transmitted disease.  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   ROS: Admits to urinary urgency since delivery along with pelvic discomfort related to pelvic separation but, denies headache, vision changes, nasal congestion, dysphagia, tinnitus, dizziness, hoarseness, cough,  chest pain, shortness of breath, nausea, vomiting, diarrhea,constipation,  urinary frequency, urgency  dysuria, hematuria, vaginitis symptoms,  swelling of joints,easy bruising,  myalgias, arthralgias, skin rashes, unexplained weight loss and except as is mentioned in the history of present illness, patient's review of systems is otherwise negative.   Physical Exam  Bp:  98/64  P: 60    Temperature:  98.4 degrees F orally      Weight: 176 lbs.  Height: 5\' 8"   BMI: 26.8  Neck: supple without masses or thyromegaly Lungs: clear to auscultation Heart: regular rate and rhythm Abdomen: soft, non-tender and no organomegaly Pelvic: Patient Refused Pelvic Exam Extremities:  no clubbing, cyanosis or edema   Assesment:   Bilateral Ovarian Dermoid Cysts   Disposition:  A discussion was held with patient regarding the indication for her procedure(s) along with the risks  of surgery to include, but not limited to: reaction to anesthesia,  damage to adjacent organs, infection and excessive bleeding. The patient verbalized understanding of these risks and has consented to proceed with Removal of Bilateral Ovarian Dermoid Cysts at [facility] on June 19, 2015.  CSN# [id number]   Ryane Konieczny J. Florene Glen, PA-C  for Dr. Waymon Amato  I saw and examined patient and agree with above history and physical.  No changes in above history and physical exam. All questions answered.

## 2015-06-18 ENCOUNTER — Other Ambulatory Visit (HOSPITAL_COMMUNITY): Payer: BLUE CROSS/BLUE SHIELD

## 2015-06-19 ENCOUNTER — Encounter (HOSPITAL_COMMUNITY)
Admission: RE | Disposition: A | Discharge status: Home / Self Care | Payer: Self-pay | Source: Ambulatory Visit | Attending: Obstetrics & Gynecology

## 2015-06-19 ENCOUNTER — Inpatient Hospital Stay (HOSPITAL_COMMUNITY)
Admission: RE | Admit: 2015-06-19 | Discharge: 2015-06-21 | DRG: 743 | Disposition: A | Discharge status: Home / Self Care | Payer: BLUE CROSS/BLUE SHIELD | Source: Ambulatory Visit | Attending: Obstetrics & Gynecology | Admitting: Obstetrics & Gynecology

## 2015-06-19 ENCOUNTER — Inpatient Hospital Stay (HOSPITAL_COMMUNITY): Payer: BLUE CROSS/BLUE SHIELD | Admitting: Anesthesiology

## 2015-06-19 ENCOUNTER — Encounter (HOSPITAL_COMMUNITY): Payer: Self-pay | Admitting: Anesthesiology

## 2015-06-19 DIAGNOSIS — D649 Anemia, unspecified: Secondary | ICD-10-CM | POA: Diagnosis present

## 2015-06-19 DIAGNOSIS — Z803 Family history of malignant neoplasm of breast: Secondary | ICD-10-CM | POA: Diagnosis not present

## 2015-06-19 DIAGNOSIS — D270 Benign neoplasm of right ovary: Secondary | ICD-10-CM | POA: Diagnosis present

## 2015-06-19 DIAGNOSIS — N83291 Other ovarian cyst, right side: Secondary | ICD-10-CM | POA: Diagnosis present

## 2015-06-19 DIAGNOSIS — D279 Benign neoplasm of unspecified ovary: Secondary | ICD-10-CM | POA: Diagnosis present

## 2015-06-19 DIAGNOSIS — Z825 Family history of asthma and other chronic lower respiratory diseases: Secondary | ICD-10-CM

## 2015-06-19 DIAGNOSIS — D271 Benign neoplasm of left ovary: Principal | ICD-10-CM | POA: Diagnosis present

## 2015-06-19 DIAGNOSIS — N83202 Unspecified ovarian cyst, left side: Secondary | ICD-10-CM

## 2015-06-19 DIAGNOSIS — N83201 Unspecified ovarian cyst, right side: Secondary | ICD-10-CM | POA: Diagnosis present

## 2015-06-19 HISTORY — PX: OVARIAN CYST REMOVAL: SHX89

## 2015-06-19 LAB — PREGNANCY, URINE: Preg Test, Ur: NEGATIVE

## 2015-06-19 SURGERY — EXCISION, CYST, OVARY
Anesthesia: General | Site: Abdomen | Laterality: Bilateral

## 2015-06-19 MED ORDER — OXYCODONE-ACETAMINOPHEN 5-325 MG PO TABS: 1.0000 | ORAL_TABLET | ORAL | Status: DC | PRN

## 2015-06-19 MED ORDER — 0.9 % SODIUM CHLORIDE (POUR BTL) OPTIME
TOPICAL | Status: DC | PRN
  Administered 2015-06-19: 1000 mL

## 2015-06-19 MED ORDER — BUPIVACAINE-EPINEPHRINE (PF) 0.25% -1:200000 IJ SOLN
INTRAMUSCULAR | Status: AC
  Filled 2015-06-19: qty 30

## 2015-06-19 MED ORDER — ROCURONIUM BROMIDE 100 MG/10ML IV SOLN
INTRAVENOUS | Status: AC
  Filled 2015-06-19: qty 1

## 2015-06-19 MED ORDER — DIPHENHYDRAMINE HCL 12.5 MG/5ML PO ELIX
12.5000 mg | ORAL_SOLUTION | Freq: Four times a day (QID) | ORAL | Status: DC | PRN
  Administered 2015-06-20: 12.5 mg via ORAL
  Filled 2015-06-19 (×2): qty 5

## 2015-06-19 MED ORDER — DIPHENHYDRAMINE HCL 50 MG/ML IJ SOLN: 12.5000 mg | Freq: Four times a day (QID) | INTRAMUSCULAR | Status: DC | PRN

## 2015-06-19 MED ORDER — DEXTROSE 5 % IV SOLN
2.0000 g | INTRAVENOUS | Status: DC
  Administered 2015-06-19: 2 g via INTRAVENOUS
  Filled 2015-06-19: qty 20

## 2015-06-19 MED ORDER — DOCUSATE SODIUM 100 MG PO CAPS
100.0000 mg | ORAL_CAPSULE | Freq: Two times a day (BID) | ORAL | Status: DC
  Administered 2015-06-20 – 2015-06-21 (×3): 100 mg via ORAL
  Filled 2015-06-19 (×3): qty 1

## 2015-06-19 MED ORDER — MENTHOL 3 MG MT LOZG: 1.0000 | LOZENGE | OROMUCOSAL | Status: DC | PRN

## 2015-06-19 MED ORDER — SCOPOLAMINE 1 MG/3DAYS TD PT72
MEDICATED_PATCH | TRANSDERMAL | Status: AC
  Administered 2015-06-19: 1.5 mg via TRANSDERMAL
  Filled 2015-06-19: qty 1

## 2015-06-19 MED ORDER — MIDAZOLAM HCL 2 MG/2ML IJ SOLN
INTRAMUSCULAR | Status: DC | PRN
  Administered 2015-06-19: 1 mg via INTRAVENOUS

## 2015-06-19 MED ORDER — LIDOCAINE HCL (CARDIAC) 20 MG/ML IV SOLN
INTRAVENOUS | Status: DC | PRN
  Administered 2015-06-19: 30 mg via INTRAVENOUS
  Administered 2015-06-19: 70 mg via INTRAVENOUS

## 2015-06-19 MED ORDER — HYDROMORPHONE HCL 1 MG/ML IJ SOLN
INTRAMUSCULAR | Status: AC
  Administered 2015-06-19: 0.5 mg via INTRAVENOUS
  Filled 2015-06-19: qty 1

## 2015-06-19 MED ORDER — LIDOCAINE HCL (CARDIAC) 20 MG/ML IV SOLN
INTRAVENOUS | Status: AC
  Filled 2015-06-19: qty 5

## 2015-06-19 MED ORDER — FENTANYL CITRATE (PF) 100 MCG/2ML IJ SOLN
INTRAMUSCULAR | Status: DC | PRN
  Administered 2015-06-19 (×5): 50 ug via INTRAVENOUS

## 2015-06-19 MED ORDER — ONDANSETRON HCL 4 MG/2ML IJ SOLN
4.0000 mg | Freq: Four times a day (QID) | INTRAMUSCULAR | Status: DC | PRN
  Administered 2015-06-19: 4 mg via INTRAVENOUS
  Filled 2015-06-19 (×2): qty 2

## 2015-06-19 MED ORDER — KETOROLAC TROMETHAMINE 30 MG/ML IJ SOLN: 30.0000 mg | Freq: Once | INTRAMUSCULAR | Status: DC

## 2015-06-19 MED ORDER — KETOROLAC TROMETHAMINE 30 MG/ML IJ SOLN
30.0000 mg | Freq: Four times a day (QID) | INTRAMUSCULAR | Status: AC
Start: 2015-06-19 — End: 2015-06-20
  Administered 2015-06-19 – 2015-06-20 (×3): 30 mg via INTRAVENOUS
  Filled 2015-06-19 (×3): qty 1

## 2015-06-19 MED ORDER — FENTANYL CITRATE (PF) 250 MCG/5ML IJ SOLN
INTRAMUSCULAR | Status: AC
  Filled 2015-06-19: qty 5

## 2015-06-19 MED ORDER — ONDANSETRON HCL 4 MG PO TABS: 4.0000 mg | ORAL_TABLET | Freq: Three times a day (TID) | ORAL | Status: DC | PRN

## 2015-06-19 MED ORDER — ONDANSETRON HCL 4 MG/2ML IJ SOLN
INTRAMUSCULAR | Status: DC | PRN
  Administered 2015-06-19: 4 mg via INTRAVENOUS

## 2015-06-19 MED ORDER — NALOXONE HCL 0.4 MG/ML IJ SOLN: 0.4000 mg | INTRAMUSCULAR | Status: DC | PRN

## 2015-06-19 MED ORDER — HYDROMORPHONE HCL 1 MG/ML IJ SOLN
0.2500 mg | INTRAMUSCULAR | Status: DC | PRN
  Administered 2015-06-19 (×2): 0.5 mg via INTRAVENOUS

## 2015-06-19 MED ORDER — ONDANSETRON HCL 4 MG/2ML IJ SOLN: 4.0000 mg | Freq: Once | INTRAMUSCULAR | Status: DC | PRN

## 2015-06-19 MED ORDER — MEPERIDINE HCL 25 MG/ML IJ SOLN: 6.2500 mg | INTRAMUSCULAR | Status: DC | PRN

## 2015-06-19 MED ORDER — ROCURONIUM BROMIDE 100 MG/10ML IV SOLN
INTRAVENOUS | Status: DC | PRN
  Administered 2015-06-19: 10 mg via INTRAVENOUS
  Administered 2015-06-19: 35 mg via INTRAVENOUS

## 2015-06-19 MED ORDER — IBUPROFEN 600 MG PO TABS
600.0000 mg | ORAL_TABLET | Freq: Four times a day (QID) | ORAL | Status: DC | PRN
  Administered 2015-06-20 – 2015-06-21 (×4): 600 mg via ORAL
  Filled 2015-06-19 (×4): qty 1

## 2015-06-19 MED ORDER — LACTATED RINGERS IV SOLN
INTRAVENOUS | Status: DC
  Administered 2015-06-20: via INTRAVENOUS

## 2015-06-19 MED ORDER — DEXAMETHASONE SODIUM PHOSPHATE 10 MG/ML IJ SOLN
INTRAMUSCULAR | Status: DC | PRN
  Administered 2015-06-19: 4 mg via INTRAVENOUS

## 2015-06-19 MED ORDER — PROPOFOL 10 MG/ML IV BOLUS
INTRAVENOUS | Status: AC
  Filled 2015-06-19: qty 20

## 2015-06-19 MED ORDER — ONDANSETRON HCL 4 MG/2ML IJ SOLN
INTRAMUSCULAR | Status: AC
  Filled 2015-06-19: qty 2

## 2015-06-19 MED ORDER — KETOROLAC TROMETHAMINE 30 MG/ML IJ SOLN
INTRAMUSCULAR | Status: DC | PRN
  Administered 2015-06-19: 30 mg via INTRAVENOUS

## 2015-06-19 MED ORDER — DEXAMETHASONE SODIUM PHOSPHATE 4 MG/ML IJ SOLN
INTRAMUSCULAR | Status: AC
  Filled 2015-06-19: qty 1

## 2015-06-19 MED ORDER — PROPOFOL 10 MG/ML IV BOLUS
INTRAVENOUS | Status: DC | PRN
  Administered 2015-06-19: 180 mg via INTRAVENOUS

## 2015-06-19 MED ORDER — BUPIVACAINE-EPINEPHRINE 0.25% -1:200000 IJ SOLN
INTRAMUSCULAR | Status: DC | PRN
  Administered 2015-06-19: 20 mL

## 2015-06-19 MED ORDER — MORPHINE SULFATE 2 MG/ML IV SOLN
INTRAVENOUS | Status: DC
Start: 2015-06-19 — End: 2015-06-20
  Administered 2015-06-19: 7.5 mg via INTRAVENOUS
  Administered 2015-06-19: 17:00:00 via INTRAVENOUS
  Administered 2015-06-20: 6 mg via INTRAVENOUS
  Administered 2015-06-20: 4 mL via INTRAVENOUS
  Administered 2015-06-20: 4.5 mg via INTRAVENOUS
  Filled 2015-06-19: qty 25

## 2015-06-19 MED ORDER — CEFAZOLIN SODIUM-DEXTROSE 2-3 GM-% IV SOLR
INTRAVENOUS | Status: AC
  Filled 2015-06-19: qty 50

## 2015-06-19 MED ORDER — LACTATED RINGERS IV SOLN
INTRAVENOUS | Status: DC
  Administered 2015-06-19 (×2): via INTRAVENOUS

## 2015-06-19 MED ORDER — KETOROLAC TROMETHAMINE 30 MG/ML IJ SOLN
INTRAMUSCULAR | Status: AC
  Filled 2015-06-19: qty 1

## 2015-06-19 MED ORDER — MIDAZOLAM HCL 2 MG/2ML IJ SOLN
INTRAMUSCULAR | Status: AC
  Filled 2015-06-19: qty 2

## 2015-06-19 MED ORDER — SCOPOLAMINE 1 MG/3DAYS TD PT72
1.0000 | MEDICATED_PATCH | Freq: Once | TRANSDERMAL | Status: DC
  Administered 2015-06-19: 1.5 mg via TRANSDERMAL

## 2015-06-19 MED ORDER — SODIUM CHLORIDE 0.9% FLUSH: 9.0000 mL | INTRAVENOUS | Status: DC | PRN

## 2015-06-19 SURGICAL SUPPLY — 33 items
CANISTER SUCT 3000ML (MISCELLANEOUS) ×3 IMPLANT
CLOTH BEACON ORANGE TIMEOUT ST (SAFETY) ×3 IMPLANT
DRAPE CESAREAN BIRTH W POUCH (DRAPES) ×3 IMPLANT
DRAPE WARM FLUID 44X44 (DRAPE) ×3 IMPLANT
DRSG OPSITE POSTOP 4X10 (GAUZE/BANDAGES/DRESSINGS) ×3 IMPLANT
DURAPREP 26ML APPLICATOR (WOUND CARE) ×3 IMPLANT
GAUZE SPONGE 4X4 16PLY XRAY LF (GAUZE/BANDAGES/DRESSINGS) ×3 IMPLANT
GLOVE BIOGEL PI IND STRL 7.0 (GLOVE) ×4 IMPLANT
GLOVE BIOGEL PI INDICATOR 7.0 (GLOVE) ×8
GLOVE SURG SS PI 6.5 STRL IVOR (GLOVE) ×3 IMPLANT
GOWN STRL REUS W/TWL LRG LVL3 (GOWN DISPOSABLE) ×12 IMPLANT
NS IRRIG 1000ML POUR BTL (IV SOLUTION) ×6 IMPLANT
PACK ABDOMINAL GYN (CUSTOM PROCEDURE TRAY) ×3 IMPLANT
PAD OB MATERNITY 4.3X12.25 (PERSONAL CARE ITEMS) ×3 IMPLANT
PENCIL SMOKE EVAC W/HOLSTER (ELECTROSURGICAL) ×3 IMPLANT
PROTECTOR NERVE ULNAR (MISCELLANEOUS) ×3 IMPLANT
SPONGE LAP 18X18 X RAY DECT (DISPOSABLE) ×3 IMPLANT
SURGIFLO W/THROMBIN 8M KIT (HEMOSTASIS) ×3 IMPLANT
SUT CHROMIC 2 0 CT 1 (SUTURE) ×3 IMPLANT
SUT MON AB 4-0 PS1 27 (SUTURE) ×3 IMPLANT
SUT PLAIN 2 0 XLH (SUTURE) ×3 IMPLANT
SUT VIC AB 0 CT1 18XCR BRD8 (SUTURE) IMPLANT
SUT VIC AB 0 CT1 27 (SUTURE) ×6
SUT VIC AB 0 CT1 27XBRD ANBCTR (SUTURE) ×3 IMPLANT
SUT VIC AB 0 CT1 8-18 (SUTURE)
SUT VIC AB 2-0 SH 27 (SUTURE) ×6
SUT VIC AB 2-0 SH 27XBRD (SUTURE) ×3 IMPLANT
SUT VIC AB 3-0 CT1 27 (SUTURE)
SUT VIC AB 3-0 CT1 TAPERPNT 27 (SUTURE) IMPLANT
SUT VICRYL 0 27 CT2 27 ABS (SUTURE) IMPLANT
SUT VICRYL 0 TIES 12 18 (SUTURE) ×3 IMPLANT
TOWEL OR 17X24 6PK STRL BLUE (TOWEL DISPOSABLE) ×6 IMPLANT
TRAY FOLEY CATH SILVER 14FR (SET/KITS/TRAYS/PACK) ×3 IMPLANT

## 2015-06-19 NOTE — Interval H&P Note (Signed)
History and Physical Interval Note:  06/19/2015 1:09 PM  Christine Mccarthy  has presented today for surgery, with the diagnosis of Bilateral Ovarian Cysts  The various methods of treatment have been discussed with the patient and family. After consideration of risks, benefits and other options for treatment, the patient has consented to  Procedure(s): LAPAROTOMY OVARIAN CYSTECTOMY (Bilateral) as a surgical intervention .  The patient's history has been reviewed, patient examined, no change in status, stable for surgery.  I have reviewed the patient's chart and labs.  Questions were answered to the patient's satisfaction.     [facility]

## 2015-06-19 NOTE — Anesthesia Preprocedure Evaluation (Addendum)
Anesthesia Evaluation    Reviewed: Allergy & Precautions, H&P , NPO status , Patient's Chart, lab work & pertinent test results  Airway Mallampati: I  TM Distance: >3 FB Neck ROM: full    Dental no notable dental hx. (+) Teeth Intact   Pulmonary neg pulmonary ROS,    Pulmonary exam normal breath sounds clear to auscultation       Cardiovascular negative cardio ROS Normal cardiovascular exam Rhythm:Regular Rate:Normal     Neuro/Psych negative neurological ROS  negative psych ROS   GI/Hepatic negative GI ROS, Neg liver ROS,   Endo/Other  negative endocrine ROS  Renal/GU negative Renal ROS     Musculoskeletal   Abdominal Normal abdominal exam  (+)   Peds  Hematology negative hematology ROS (+)   Anesthesia Other Findings   Reproductive/Obstetrics negative OB ROS                            Anesthesia Physical Anesthesia Plan  ASA: I  Anesthesia Plan: General   Post-op Pain Management:    Induction: Intravenous  Airway Management Planned: Oral ETT  Additional Equipment:   Intra-op Plan:   Post-operative Plan: Extubation in OR  Informed Consent: I have reviewed the patients History and Physical, chart, labs and discussed the procedure including the risks, benefits and alternatives for the proposed anesthesia with the patient or authorized representative who has indicated his/her understanding and acceptance.   Dental Advisory Given  Plan Discussed with: CRNA and Surgeon  Anesthesia Plan Comments:         Anesthesia Quick Evaluation

## 2015-06-19 NOTE — H&P (View-Only) (Signed)
Christine Mccarthy is a 25 y.o. female  P: 1-0-0-1  who presents for removal of bilateral dermoid cysts.  The patient was found to have bilateral dermoid cysts during her recent pregnancy but was essentially asymptomatic.  After her vaginal  delivery  03/30/2015, however,  she noticed almost daily  a dull/achy pelvic pain that was more noticeable with walking and being positioned on either side with reclining. . A pelvic ultrasound,  June 07, 2015 revealed:  uterus-5.43 x 5.13 x 3.74 cm, endometrium-4.72 mm;  Left Ovary-10.44 x 5.88 x 6.16 cm with #2 complex masses (with features of dermoids): 8.3 x 6.2 x 5.2 cm  and  3.8 x 2.2 x 2.7 cm,  neither had color Doppler flow;  Right Ovary-7.0 x 6.13 x 4.7 cm with a complex mass (dermoid appearing) 4.6 x 4.7 x 4.4 cm with no color Doppler flow.    A CBC, at that same time was normal except H/H = 10.2/33.8 as was an AFP Tumor Marker and Inhibin A & B.  Prior to pregnancy the patient had regular menstrual periods for 7 days with pad changed 4 times a day and  no cramps.  Since delivery she's had not changes in bowel or bladder function but admits to post coital pelvic discomfort (achiness) that will last for 2 days.  Given the size and nature of her bilateral ovarian cysts,  the patient has decided to proceed with bilateral ovarian cystectomy for management.   Past Medical History  OB History: G:1;  P: 1-0-0-1;  SVB  March 30, 2015  GYN History: menarche: 25 YO    LMP: Breastfeeding (no menses)    Contracepton: Jollivette Progestin Only Pills  The patient denies history of sexually transmitted disease.  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   ROS: Admits to urinary urgency since delivery along with pelvic discomfort related to pelvic separation but, denies headache, vision changes, nasal congestion, dysphagia, tinnitus, dizziness, hoarseness, cough,  chest pain, shortness of breath, nausea, vomiting, diarrhea,constipation,  urinary frequency, urgency  dysuria, hematuria, vaginitis symptoms,  swelling of joints,easy bruising,  myalgias, arthralgias, skin rashes, unexplained weight loss and except as is mentioned in the history of present illness, patient's review of systems is otherwise negative.   Physical Exam  Bp:  98/64  P: 60    Temperature:  98.4 degrees F orally      Weight: 176 lbs.  Height: 5\' 8"   BMI: 26.8  Neck: supple without masses or thyromegaly Lungs: clear to auscultation Heart: regular rate and rhythm Abdomen: soft, non-tender and no organomegaly Pelvic: Patient Refused Pelvic Exam Extremities:  no clubbing, cyanosis or edema   Assesment:   Bilateral Ovarian Dermoid Cysts   Disposition:  A discussion was held with patient regarding the indication for her procedure(s) along with the risks  of surgery to include, but not limited to: reaction to anesthesia,  damage to adjacent organs, infection and excessive bleeding. The patient verbalized understanding of these risks and has consented to proceed with Removal of Bilateral Ovarian Dermoid Cysts at [facility] on June 19, 2015.  CSN# [id number]   [place] J. Florene Glen, PA-C  for Dr. Waymon Amato  I saw and examined patient and agree with above history and physical.  No changes in above history and physical exam. All questions answered.

## 2015-06-19 NOTE — Transfer of Care (Signed)
Immediate Anesthesia Transfer of Care Note  Patient: Christine Mccarthy  Procedure(s) Performed: Procedure(s): LAPAROTOMY OVARIAN CYSTECTOMY (Bilateral)  Patient Location: PACU  Anesthesia Type:General  Level of Consciousness: awake, alert  and oriented  Airway & Oxygen Therapy: Patient Spontanous Breathing and Patient connected to nasal cannula oxygen  Post-op Assessment: Report given to RN, Post -op Vital signs reviewed and stable and Patient moving all extremities  Post vital signs: Reviewed and stable  Last Vitals:  Filed Vitals:   06/19/15 1148  BP: 92/74  Pulse: 80  Temp: 36.9 C  Resp: 20    Complications: No apparent anesthesia complications

## 2015-06-19 NOTE — Op Note (Signed)
DATE OF SURGERY: 06/19/15  PREOP DIAGNOSIS:  Bilateral Ovarian cysts- dermoid cysts  POSTOP DIAGNOSIS: Same as above.  PROCEDURE: Laparotomy, ovarian cystectomies.     SURGEON: Dr.  Johny Sax Alesia Richards  ASSISTANT: Earnstine Regal, PA.   ANESTHESIA: General  COMPLICATIONS: None  FINDINGS: Two left ovarian dermoid cysts, one about 9cm large and second one 5cm large.  One right dermoid cyst about 6cm large. One right simple 2 cm cyst with clear fluid within.  Normal uterus and fallopian tubes  bilaterally.    EBL: 50 cc  IV FLUID: 1800 cc LR   URINE OUTPUT: 325 cc clear urine  INDICATIONS: 25 y/o P1 with bilateral ovarian cysts thought to be dermoids here for laparotomy and ovarian cystectomies.  Patient reported abdominal pain that was intermittent and desired removal of the cysts.  A recent ultrasound at the office had shown a large left ovarian cyst consistent with dermoid 8 cm, a 4cm left ovarian dermoid cyst and a  5cm right dermoid cyst.  She was consented for the procedure after explaining risks benefits and alternatives of the procedure.    PROCEDURE:   Informed consent was obtained from the patient to undergo the procedure. She was taken to the operating room where general anesthesia was found to be adequate. She was prepped and draped in the usual sterile fashion and a Foley catheter was placed. She received 2 g of IV Ancef pre operatively.   A Pfannenstiel incision was made with the scalpel and the incision extended through the subcutaneous layer and also the fascia with the bovie. Small perforators in the subcutaneous layer were contained with the Bovie. The fascia was nicked in the midline and then was further separated from the rectus muscles bilaterally using Mayo scissors. Kochers were placed inferiorly and then superiorly to allow further separation of fascia from the rectus muscles.  The peritoneal cavity was entered bluntly with the fingers. The Fredia Sorrow retractor was  placed in after packing away the bowel.    The  left adnexa was identified and the ovarian cysts brought to the incision.  The large dermoid cyst capsule was incised and the dermoid cyst was separated from the sorrounding ovarian tissue with metzenbaum scissors and blunt dissection.  The base of the cyst attachment was clamped and bovied for hemostasis.  Similar procedure was done to resect another dermoid cyst in the left adnexa.  Attention was turned to the right side and a 6cm dermoid cyst was removed in a similar fashion.  There was another small right ovarian cyst that had clear fluid.  This small cyst was indavertently ruptured with clear fluid released.  The cyst wall of thi small cyst was then removed from the sorrounding ovarian tissue.  Surgiflo was then placed to enhance hemostasis over the remaining cyst beds.  Small redundant ovarian tissue was trimmed off.  The edges of the remaining ovarian tissue was closed off with 2-0 vicryl.  Excellent hemostasis was noted over the ovaries.      The muscles were then reapproximated using chromic suture.  Fascia was closed using 0 Vicryl in a running stitch. The subcutaneous layer was irrigated and suctioned out. Small perforators were contained with the bovie.  The subcutaneous layer was closed over using 1-0 plain in interrupted stitches. The skin was closed using 4-0 Monocryl. Dermabond was applied. Honeycomb was then applied. The patient was then cleaned and she was taken to the recovery room in stable condition.   SPECIMEN: 3 dermoid ovarian  cysts. Redundant ovarian tissue.  Right ovarian cyst wall.   DISPOSITION: TO PACU, STABLE.

## 2015-06-19 NOTE — Anesthesia Procedure Notes (Signed)
Procedure Name: Intubation Date/Time: 06/19/2015 1:20 PM Performed by: Tobin Chad Pre-anesthesia Checklist: Patient identified, Emergency Drugs available, Suction available and Patient being monitored Patient Re-evaluated:Patient Re-evaluated prior to inductionOxygen Delivery Method: Simple face mask and Circle system utilized Preoxygenation: Pre-oxygenation with 100% oxygen Intubation Type: IV induction and Inhalational induction Ventilation: Mask ventilation without difficulty Laryngoscope Size: Mac and 3 Grade View: Grade I Tube type: Oral Tube size: 7.0 mm Number of attempts: 1 Placement Confirmation: breath sounds checked- equal and bilateral,  positive ETCO2 and ETT inserted through vocal cords under direct vision Secured at: 22 cm Tube secured with: Tape Dental Injury: Teeth and Oropharynx as per pre-operative assessment

## 2015-06-19 NOTE — Brief Op Note (Signed)
06/19/2015  3:33 PM  PATIENT:  Christine Mccarthy  25 y.o. female  PRE-OPERATIVE DIAGNOSIS:  Bilateral Ovarian Cysts  POST-OPERATIVE DIAGNOSIS:  Bilateral Ovarian Cysts  PROCEDURE:  Procedure(s): LAPAROTOMY OVARIAN CYSTECTOMY (Bilateral)  SURGEON:  Surgeon(s) and Role:    * Waymon Amato, MD - Primary  ASSISTANTS: Earnstine Regal, PA.    ANESTHESIA:   general  EBL:  Total I/O In: 1800 [I.V.:1800] Out: 375 [Urine:325; Blood:50]  BLOOD ADMINISTERED:none  DRAINS: none   LOCAL MEDICATIONS USED:  MARCAINE     SPECIMEN:  Source of Specimen:  Left and right ovarian cysts, Redundant ovarian tissue from left and right ovaries.   DISPOSITION OF SPECIMEN:  PATHOLOGY  COUNTS:  YES  TOURNIQUET:  * No tourniquets in log *  DICTATION: .Dragon Dictation  PLAN OF CARE: Admit to inpatient   PATIENT DISPOSITION:  PACU - hemodynamically stable.   Delay start of Pharmacological VTE agent (>24hrs) due to surgical blood loss or risk of bleeding: not applicable

## 2015-06-19 NOTE — Anesthesia Postprocedure Evaluation (Signed)
Anesthesia Post Note  Patient: Christine Mccarthy  Procedure(s) Performed: Procedure(s) (LRB): LAPAROTOMY OVARIAN CYSTECTOMY (Bilateral)  Patient location during evaluation: PACU Anesthesia Type: General Level of consciousness: awake and alert Pain management: pain level controlled Vital Signs Assessment: post-procedure vital signs reviewed and stable Respiratory status: spontaneous breathing, nonlabored ventilation, respiratory function stable and patient connected to nasal cannula oxygen Cardiovascular status: blood pressure returned to baseline and stable Postop Assessment: no signs of nausea or vomiting Anesthetic complications: no    Last Vitals:  Filed Vitals:   06/19/15 1637 06/19/15 1645  BP:  126/73  Pulse:  75  Temp: 37.3 C 37.1 C  Resp:  18    Last Pain:  Filed Vitals:   06/19/15 1651  PainSc: 6                  Montez Hageman

## 2015-06-19 NOTE — Progress Notes (Signed)
Dr. Alesia Richards called for patient update. Per Dr. Alesia Richards, do not ambulate patient until morning, after advanced to regular diet and off PCA.

## 2015-06-20 ENCOUNTER — Encounter (HOSPITAL_COMMUNITY): Payer: Self-pay | Admitting: Obstetrics & Gynecology

## 2015-06-20 LAB — CBC
HCT: 26.7 % — ABNORMAL LOW (ref 36.0–46.0)
HEMOGLOBIN: 8.2 g/dL — AB (ref 12.0–15.0)
MCH: 24.7 pg — ABNORMAL LOW (ref 26.0–34.0)
MCHC: 30.7 g/dL (ref 30.0–36.0)
MCV: 80.4 fL (ref 78.0–100.0)
Platelets: 287 10*3/uL (ref 150–400)
RBC: 3.32 MIL/uL — AB (ref 3.87–5.11)
RDW: 16.3 % — ABNORMAL HIGH (ref 11.5–15.5)
WBC: 10 10*3/uL (ref 4.0–10.5)

## 2015-06-20 MED ORDER — OXYCODONE-ACETAMINOPHEN 5-325 MG PO TABS
1.0000 | ORAL_TABLET | ORAL | Status: DC | PRN
  Administered 2015-06-20 – 2015-06-21 (×4): 1 via ORAL
  Filled 2015-06-20 (×4): qty 1

## 2015-06-20 NOTE — Anesthesia Postprocedure Evaluation (Signed)
Anesthesia Post Note  Patient: Christine Mccarthy  Procedure(s) Performed: Procedure(s) (LRB): LAPAROTOMY OVARIAN CYSTECTOMY (Bilateral)  Patient location during evaluation: [facility] Anesthesia Type: General Level of consciousness: awake and alert and oriented Pain management: pain level controlled Vital Signs Assessment: post-procedure vital signs reviewed and stable Respiratory status: spontaneous breathing Cardiovascular status: blood pressure returned to baseline Postop Assessment: no signs of nausea or vomiting and adequate PO intake Anesthetic complications: no    Last Vitals:  Filed Vitals:   06/20/15 0200 06/20/15 0511  BP: 119/61 113/61  Pulse: 61 51  Temp: 36.7 C 37.3 C  Resp: 18 15    Last Pain:  Filed Vitals:   06/20/15 0516  PainSc: 4                  Latiqua Daloia

## 2015-06-20 NOTE — Progress Notes (Signed)
Christine Mccarthy is a77 y.o.  [id number]  Post Op Date #1:  Bilateral Ovarian Cystectomies  Subjective: Patient is Doing well postoperatively. Patient has Pain is controlled with current analgesics. Medications being used: prescription NSAID's including Ketorolac 30 mg and narcotic analgesics including Morphine PCA. Tolerating liquids without nausea and  has dangled at bedside with no dizziness.  Hasn't voided yet as Foley removed just an hour ago and has not ambulated.   Objective: Vital signs in last 24 hours: Temp:  [98 F (36.7 C)-99.4 F (37.4 C)] 99.1 F (37.3 C) (03/29 0511) Pulse Rate:  [51-80] 51 (03/29 0511) Resp:  [15-20] 15 (03/29 0511) BP: (92-136)/(59-84) 113/61 mmHg (03/29 0511) SpO2:  [97 %-100 %] 100 % (03/29 0511) Weight:  [174 lb (78.926 kg)] 174 lb (78.926 kg) (03/28 1800)  Intake/Output from previous day: 03/28 0701 - 03/29 0700 In: 4615.5 [P.O.:1080; I.V.:3535.5] Out: 1750 [Urine:1700] Intake/Output this shift:    Recent Labs Lab 06/15/15 0840 06/20/15 0622  WBC 8.2 10.0  HGB 9.8* 8.2*  HCT 31.7* 26.7*  PLT 303 287    No results for input(s): NA, K, CL, CO2, BUN, CREATININE, CALCIUM, PROT, BILITOT, ALKPHOS, ALT, AST, GLUCOSE in the last 168 hours.  Invalid input(s): LABALBU  EXAM: General: alert, cooperative and no distress Resp: clear to auscultation bilaterally Cardio: regular rate and rhythm, S1, S2 normal, no murmur, click, rub or gallop GI: Bowel sounds, present,  dressing clean/dry/intact; no visible evidence of infection through the honeycomb dressing Extremities: No calf tenderness.   Assessment: s/p Procedure(s): LAPAROTOMY OVARIAN CYSTECTOMY: stable, progressing well and anemia  Plan: Advance diet Encourage ambulation Advance to PO medication Routine care  LOS: 1 day    Syrus Nakama, PA-C 06/20/2015 7:49 AM

## 2015-06-21 MED ORDER — FERROUS SULFATE 325 (65 FE) MG PO TABS: 325.0000 mg | ORAL_TABLET | Freq: Two times a day (BID) | ORAL | Status: DC

## 2015-06-21 MED ORDER — FERROUS SULFATE 325 (65 FE) MG PO TABS: 325.0000 mg | ORAL_TABLET | Freq: Two times a day (BID) | ORAL | Status: AC

## 2015-06-21 MED ORDER — OXYCODONE-ACETAMINOPHEN 5-325 MG PO TABS: 1.0000 | ORAL_TABLET | Freq: Four times a day (QID) | ORAL | Status: AC | PRN

## 2015-06-21 MED ORDER — IBUPROFEN 600 MG PO TABS: ORAL_TABLET | ORAL | Status: AC

## 2015-06-21 MED ORDER — FERROUS SULFATE 325 (65 FE) MG PO TABS
325.0000 mg | ORAL_TABLET | Freq: Two times a day (BID) | ORAL | Status: DC
  Administered 2015-06-21: 325 mg via ORAL
  Filled 2015-06-21: qty 1

## 2015-06-21 NOTE — Discharge Summary (Signed)
Physician Discharge Summary  Patient ID: Christine Mccarthy MRN: [id number] DOB/AGE: [birth year] 24 y.o.  Admit date: 06/19/2015 Discharge date: 06/21/2015   Discharge Diagnoses:  Bilateral Ovarian Cysts (Dermoid) and Right Simple Cyst Active Problems:   Bilateral ovarian cysts   Dermoid cyst of ovary   Operation:  Laparotomy with Bilateral Ovarian Cystectomies   Discharged Condition: Good  Hospital Course:  On the date of admission,  the patient underwent the aforementioned procedure and tolerated them well.  The patient was found to have #2 left dermoid cysts measuring 9 cm and 5 cm respectively as well as a 6 cm right dermoid.  Additionally she was found to have a 2 cm simple cyst on her right ovary.  Post operative course was unremarkable with the patient resuming bowel and bladder function by post operative day #2 and was therefore deemed ready for discharge home.  Discharge hemoglobin was 8.2.  Disposition: 01-Home or Self Care  Discharge Medications:    Medication List    TAKE these medications        [employer] PRENATAL GUMMY 0.4-113.5 MG Chew  Chew 1 tablet by mouth daily.     ferrous sulfate 325 (65 FE) MG tablet  Take 1 tablet (325 mg total) by mouth 2 (two) times daily with a meal.     ibuprofen 600 MG tablet  Commonly known as:  ADVIL,MOTRIN  1  po  pc every 6 hours for 5 days then as needed for pain     oxyCODONE-acetaminophen 5-325 MG tablet  Commonly known as:  PERCOCET/ROXICET  Take 1-2 tablets by mouth every 6 (six) hours as needed for moderate pain.     senna-docusate 8.6-50 MG tablet  Commonly known as:  Senokot-S  Take 2 tablets by mouth at bedtime as needed for mild constipation.           Follow-up: Dr. Waymon Amato on July 03, 2015 at 3:45 pm   and July 16, 2015 at 8:30 a.m.   SignedEarnstine Regal, PA-C 06/21/2015, 8:04 AM

## 2015-06-21 NOTE — Progress Notes (Signed)
Peni Mceldowney is a38 y.o.  [id number]  Post Op Date # 2: Bilateral Ovarian Cystectomies  Subjective: Patient is Doing well postoperatively. The patient however, reports being "shaky" when she ambulates.  Denies any dizziness or lighheadedness. Patient has Pain is controlled with current analgesics. Medications being used: prescription NSAID's including Ibuprofen and narcotic analgesics including Percocet. Voiding without difficulty and tolerating a regular diet.   Objective: Vital signs in last 24 hours: Temp:  [98.1 F (36.7 C)-99.1 F (37.3 C)] 98.5 F (36.9 C) (03/30 [id number]) Pulse Rate:  [49-66] 64 (03/30 0608) Resp:  [15-18] 18 (03/30 0608) BP: (96-117)/(50-57) 117/50 mmHg (03/30 0608) SpO2:  [100 %] 100 % (03/30 [id number])  Intake/Output from previous day: 03/29 0701 - 03/30 0700 In: 1840.4 [P.O.:1080; I.V.:760.4] Out: 1400 [Urine:1400] Intake/Output this shift:    Recent Labs Lab 06/15/15 0840 06/20/15 0622  WBC 8.2 10.0  HGB 9.8* 8.2*  HCT 31.7* 26.7*  PLT 303 287    No results for input(s): NA, K, CL, CO2, BUN, CREATININE, CALCIUM, PROT, BILITOT, ALKPHOS, ALT, AST, GLUCOSE in the last 168 hours.  Invalid input(s): LABALBU  EXAM: General: alert, cooperative, no distress and when the patient ambulates,  she has to hold onto things and visibly is tremulous Resp: clear to auscultation bilaterally Cardio: regular rate and rhythm, S1, S2 normal, no murmur, click, rub or gallop GI: Bowel sounds present and wound dressing is clean/dry/intact; no evidence of infection seen through honeycomb dressing Extremities: Homans sign is negative, no sign of DVT and no calf tenderness.   Assessment: s/p Procedure(s): LAPAROTOMY OVARIAN CYSTECTOMY: stable, progressing well, anemia and tremulous.  Plan: Advised Dr. Alesia Richards of the patient being tremulous with ambulation.  Routine care  LOS: 2 days    Chiquita Heckert, PA-C 06/21/2015 7:46 AM

## 2015-06-21 NOTE — Discharge Instructions (Signed)
Call [facility] @ [phone] if:  You have a temperature greater than or equal to 100.4 degrees Farenheit orally You have pain that is not made better by the pain medication given and taken as directed You have excessive bleeding or problems urinating  Take Colace (Docusate Sodium/Stool Softener) 100 mg 2-3 times daily while taking narcotic pain medicine to avoid constipation or until bowel movements are regular. Take Ibuprofen  600 mg   with food every 6 hours  for  5 days then as needed for pain  You may drive after 2  weeks You may walk up steps  You may shower  You may resume a regular diet  Keep incisions clean and dry and remove honeycomb dressing on June 26, 2015  Do not lift over 15 pounds for 6 weeks Avoid anything in vagina for 6 weeks (or until after your post-operative visit)

## 2015-06-21 NOTE — Progress Notes (Signed)
Discharge teaching complete. Pt understood all information and did not have any questions. Pt ambulated out of the hospital and discharged home to family.  

## 2016-08-06 ENCOUNTER — Encounter: Payer: Self-pay | Admitting: Gynecology

## 2017-07-18 IMAGING — US US MFM OB LIMITED
1 series · 15 of 16 positions shown · non-contrast
Comparison: none

pm)

MAU/Triage
STANDARD CNM
1  AYUSO Finn Olav           [id number]      [id number]     [id number]
2  AYUSO Finn Olav           [id number]      [id number]     [id number]
Indications
Postdate pregnancy (40-42 weeks)
40 weeks gestation of pregnancy
Non-reactive NST
OB History
Gravidity:    1         Term:   0        Prem:   0        SAB:   0
TOP:          0       Ectopic:  0        Living: 0
Fetal Evaluation
Num Of Fetuses:     1
Fetal Heart         151
Rate(bpm):
Cardiac Activity:   Observed
Presentation:       Cephalic
Placenta:           Anterior, above cervical os
P. Cord Insertion:  Not well visualized
Amniotic Fluid
AFI FV:      Subjectively within normal limits
AFI Sum:     7.76    cm       12  %Tile     Larg Pckt:    3.65  cm
RUQ:   1.91    cm   RLQ:    2.2    cm    LUQ:   3.65    cm   LLQ:    0      cm
Biophysical Evaluation
Amniotic F.V:   Within normal limits       F. Tone:         Observed
F. Movement:    Not Observed               Score:           [DATE]
F. Breathing:   Observed
Gestational Age
Best:          40w 4d     Det. By:  Previous Ultrasound      EDD:   03/27/15
Cervix Uterus Adnexa
Cervix
Not visualized (advanced GA >72wks)
Impression
INDICATION: 24 yr old G1P0 at 69w6d for BPP secondary to
nonreactive NST. Remote read.

[Series 1: us mfm ob limited · 16 acquisitions, 15 frames shown]
[im 1/16]
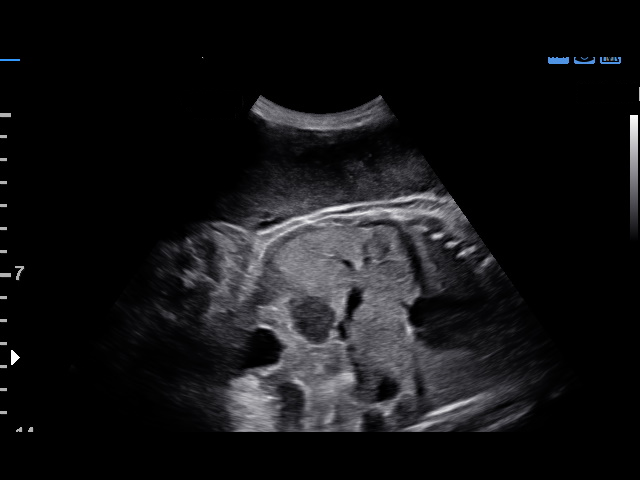
[im 2/16]
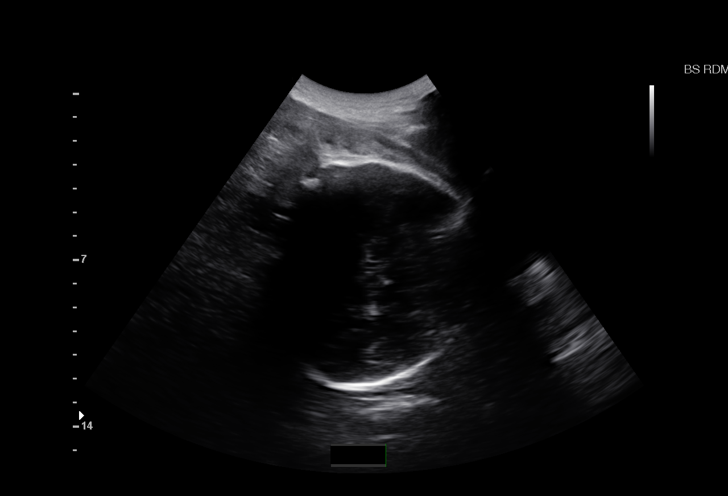
[im 3/16]
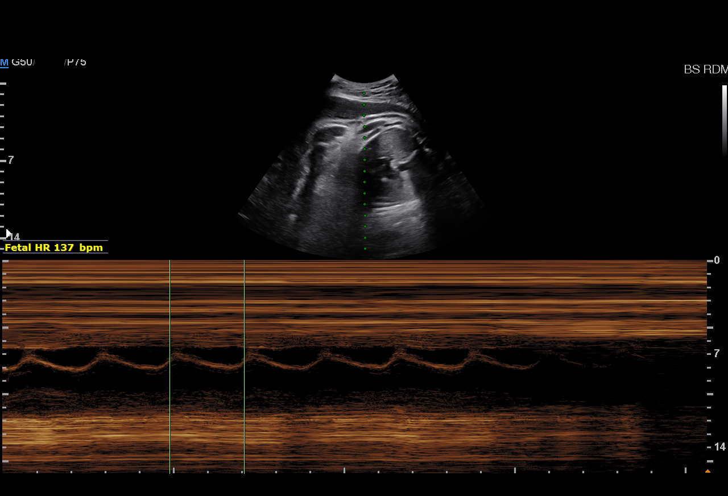
[im 4/16]
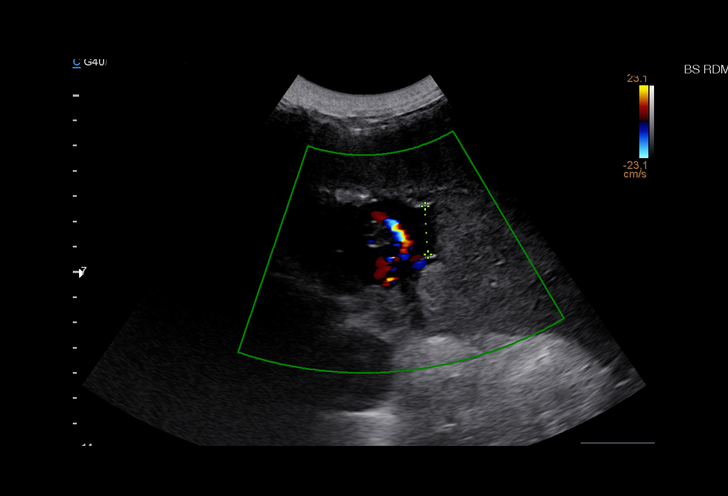
[im 5/16]
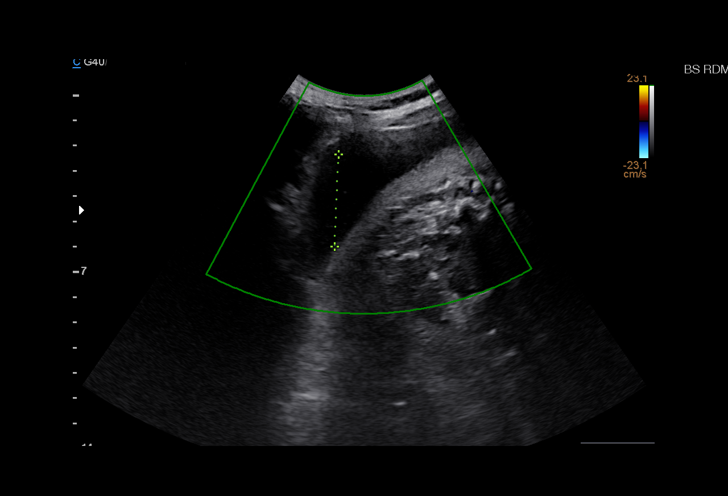
[im 6/16]
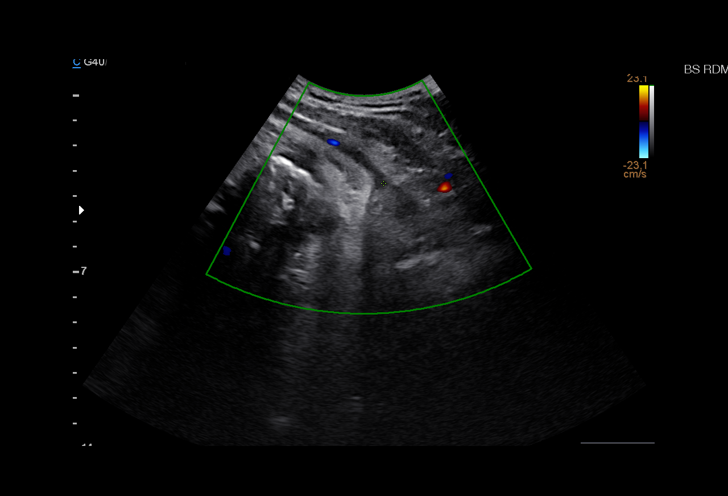
[im 7/16]
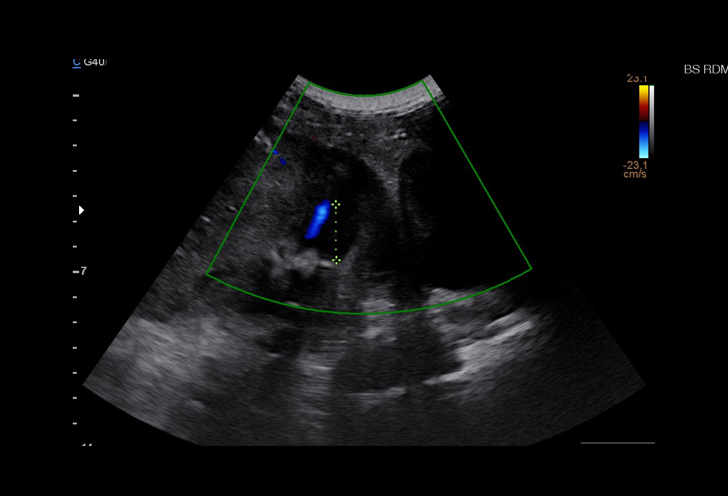
[im 9/16]
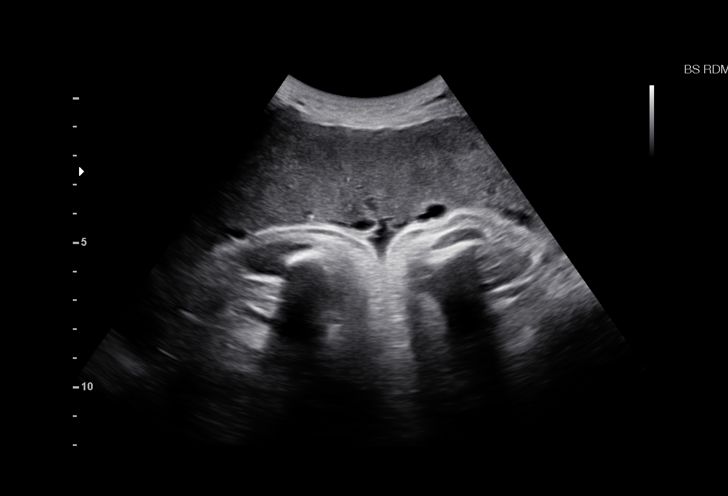
[im 10/16]
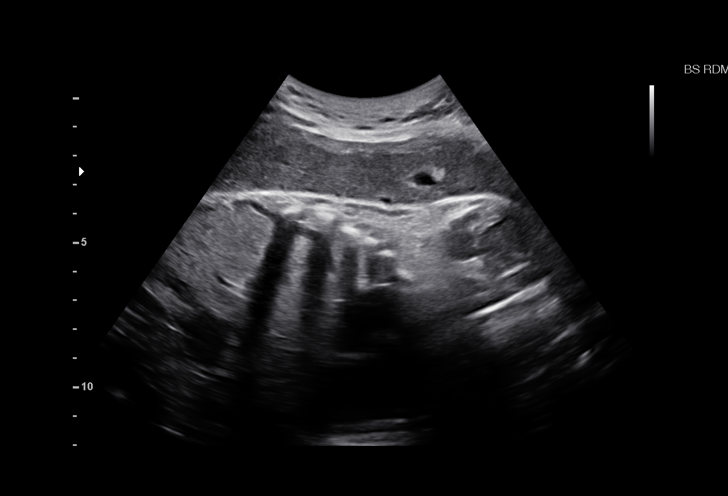
[im 11/16]
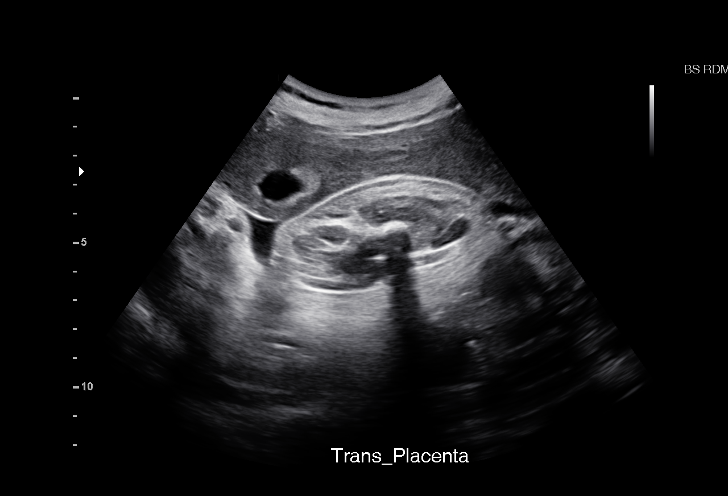
[im 12/16]
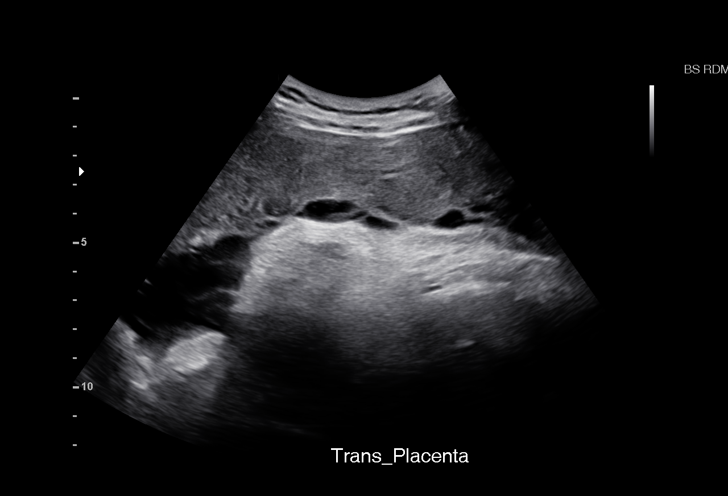
[im 13/16]
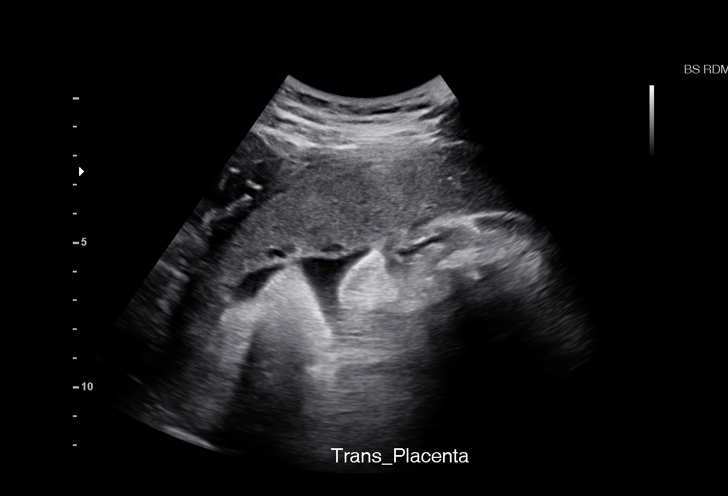
[im 14/16]
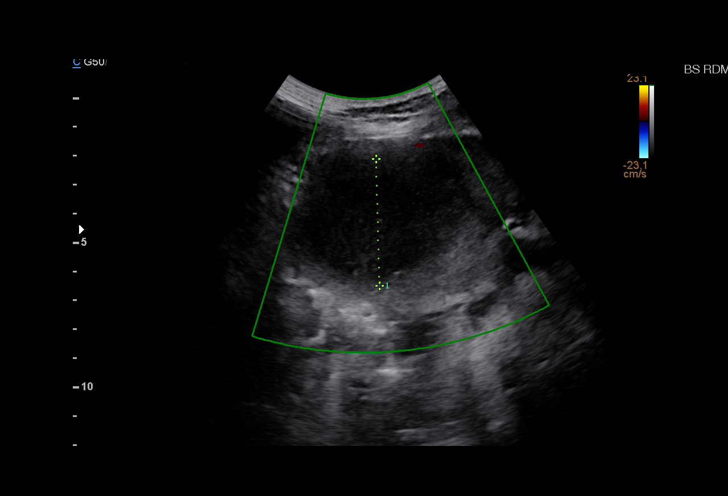
[im 15/16]
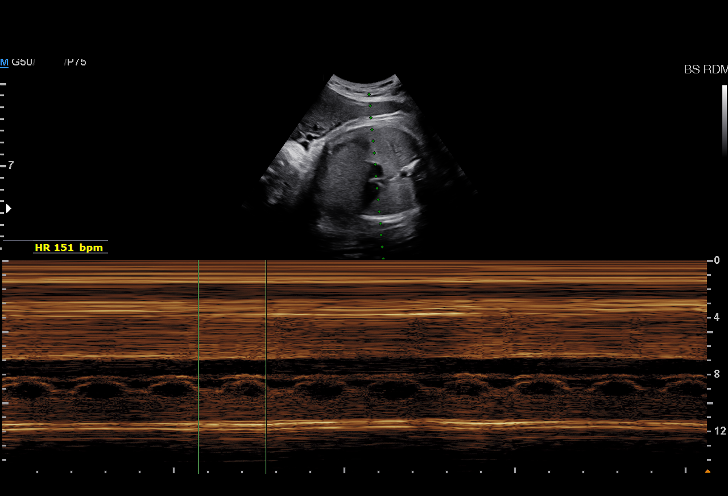
[im 16/16]
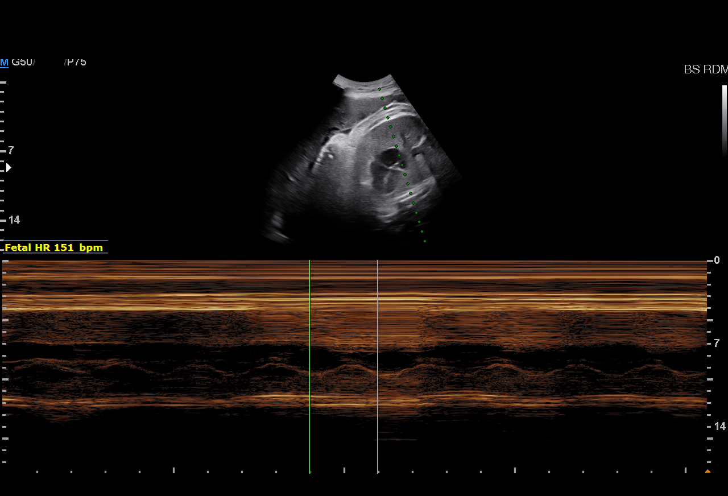

[15 of 16 positions shown; findings below may reference images not displayed]

FINDINGS: 1. Single intrauterine pregnancy.
2. Anterior placenta without evidence of previa.
3. Normal amniotic fluid index.
4. Biophysical profile is [DATE] (-2 for movement).
Recommendations

1. BPP [DATE]:
- report sent to provider
- patient being admitted for induction of labor
- recommend continuous fetal monitoring and management
per primary OB
2. Normal AFI

## 2020-08-03 ENCOUNTER — Other Ambulatory Visit: Payer: Self-pay | Admitting: Obstetrics & Gynecology
